# Patient Record
Sex: Male | Born: 1945 | Race: White | Hispanic: No | Smoking: Current every day smoker
Health system: Southern US, Community
[De-identification: ages and names within clinical notes are randomized; demographics above are authoritative.]

## PROBLEM LIST (undated history)

## (undated) DIAGNOSIS — E7800 Pure hypercholesterolemia, unspecified: Secondary | ICD-10-CM

## (undated) DIAGNOSIS — I10 Essential (primary) hypertension: Secondary | ICD-10-CM

## (undated) DIAGNOSIS — R569 Unspecified convulsions: Secondary | ICD-10-CM

## (undated) DIAGNOSIS — F329 Major depressive disorder, single episode, unspecified: Secondary | ICD-10-CM

## (undated) DIAGNOSIS — F32A Depression, unspecified: Secondary | ICD-10-CM

## (undated) DIAGNOSIS — F419 Anxiety disorder, unspecified: Secondary | ICD-10-CM

## (undated) HISTORY — PX: OTHER SURGICAL HISTORY: SHX169

---
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
## 1999-02-14 ENCOUNTER — Ambulatory Visit (HOSPITAL_COMMUNITY): Admission: RE | Admit: 1999-02-14 | Discharge: 1999-02-14 | Payer: Self-pay | Admitting: Cardiology

## 1999-02-14 ENCOUNTER — Encounter: Payer: Self-pay | Admitting: Cardiology

## 2001-08-24 ENCOUNTER — Ambulatory Visit (HOSPITAL_COMMUNITY): Admission: RE | Admit: 2001-08-24 | Discharge: 2001-08-24 | Payer: Self-pay | Admitting: Cardiology

## 2001-08-24 ENCOUNTER — Encounter: Payer: Self-pay | Admitting: Cardiology

## 2004-02-29 ENCOUNTER — Inpatient Hospital Stay (HOSPITAL_COMMUNITY): Admission: EM | Admit: 2004-02-29 | Discharge: 2004-03-02 | Payer: Self-pay | Admitting: Emergency Medicine

## 2004-05-10 ENCOUNTER — Emergency Department (HOSPITAL_COMMUNITY): Admission: EM | Admit: 2004-05-10 | Discharge: 2004-05-10 | Payer: Self-pay | Admitting: Emergency Medicine

## 2004-07-03 ENCOUNTER — Ambulatory Visit (HOSPITAL_COMMUNITY): Admission: RE | Admit: 2004-07-03 | Discharge: 2004-07-03 | Payer: Self-pay | Admitting: *Deleted

## 2004-07-03 ENCOUNTER — Ambulatory Visit: Payer: Self-pay | Admitting: *Deleted

## 2004-07-24 ENCOUNTER — Ambulatory Visit: Payer: Self-pay | Admitting: *Deleted

## 2004-10-07 ENCOUNTER — Emergency Department (HOSPITAL_COMMUNITY): Admission: EM | Admit: 2004-10-07 | Discharge: 2004-10-07 | Payer: Self-pay | Admitting: Emergency Medicine

## 2004-10-21 ENCOUNTER — Emergency Department (HOSPITAL_COMMUNITY): Admission: EM | Admit: 2004-10-21 | Discharge: 2004-10-21 | Payer: Self-pay | Admitting: Emergency Medicine

## 2005-02-01 ENCOUNTER — Emergency Department (HOSPITAL_COMMUNITY): Admission: EM | Admit: 2005-02-01 | Discharge: 2005-02-01 | Payer: Self-pay | Admitting: Emergency Medicine

## 2007-04-28 ENCOUNTER — Encounter: Payer: Self-pay | Admitting: Emergency Medicine

## 2007-04-28 ENCOUNTER — Ambulatory Visit: Payer: Self-pay | Admitting: Cardiovascular Disease

## 2007-04-28 ENCOUNTER — Inpatient Hospital Stay (HOSPITAL_COMMUNITY): Admission: EM | Admit: 2007-04-28 | Discharge: 2007-04-30 | Payer: Self-pay | Admitting: Emergency Medicine

## 2007-05-19 ENCOUNTER — Ambulatory Visit: Payer: Self-pay | Admitting: Cardiology

## 2007-05-22 ENCOUNTER — Emergency Department (HOSPITAL_COMMUNITY): Admission: EM | Admit: 2007-05-22 | Discharge: 2007-05-22 | Payer: Self-pay | Admitting: Emergency Medicine

## 2007-06-06 ENCOUNTER — Emergency Department (HOSPITAL_COMMUNITY): Admission: EM | Admit: 2007-06-06 | Discharge: 2007-06-06 | Payer: Self-pay | Admitting: Emergency Medicine

## 2008-01-13 ENCOUNTER — Ambulatory Visit: Payer: Self-pay | Admitting: Cardiology

## 2010-09-09 ENCOUNTER — Emergency Department (HOSPITAL_COMMUNITY)
Admission: EM | Admit: 2010-09-09 | Discharge: 2010-09-09 | Disposition: A | Discharge status: Home / Self Care | Payer: Medicare Other | Attending: Emergency Medicine | Admitting: Emergency Medicine

## 2010-09-09 ENCOUNTER — Emergency Department (HOSPITAL_COMMUNITY): Payer: Medicare Other

## 2010-09-09 DIAGNOSIS — G40909 Epilepsy, unspecified, not intractable, without status epilepticus: Secondary | ICD-10-CM | POA: Insufficient documentation

## 2010-09-09 DIAGNOSIS — R569 Unspecified convulsions: Secondary | ICD-10-CM | POA: Insufficient documentation

## 2010-09-09 LAB — URINALYSIS, ROUTINE W REFLEX MICROSCOPIC
Bilirubin Urine: NEGATIVE
Glucose, UA: NEGATIVE mg/dL
Ketones, ur: NEGATIVE mg/dL
Leukocytes, UA: NEGATIVE
Protein, ur: 30 mg/dL — AB
Specific Gravity, Urine: >1.03 — ABNORMAL HIGH (ref 1.005–1.030)
Urobilinogen, UA: 0.2 mg/dL (ref 0.0–1.0)
pH: 5.5 (ref 5.0–8.0)

## 2010-09-09 LAB — COMPREHENSIVE METABOLIC PANEL
ALT: 14 U/L (ref 0–53)
AST: 20 U/L (ref 0–37)
Albumin: 4.1 g/dL (ref 3.5–5.2)
Alkaline Phosphatase: 61 U/L (ref 39–117)
Calcium: 8.9 mg/dL (ref 8.4–10.5)
Chloride: 105 mEq/L (ref 96–112)
Creatinine, Ser: 0.91 mg/dL (ref 0.4–1.5)
GFR calc Af Amer: >60 mL/min (ref >60)
GFR calc non Af Amer: >60 mL/min (ref >60)
Glucose, Bld: 163 mg/dL — ABNORMAL HIGH (ref 70–99)
Potassium: 3.9 mEq/L (ref 3.5–5.1)
Sodium: 133 mEq/L — ABNORMAL LOW (ref 135–145)
Total Protein: 6.9 g/dL (ref 6.0–8.3)

## 2010-09-09 LAB — CBC
HCT: 43.7 % (ref 39.0–52.0)
Hemoglobin: 15.1 g/dL (ref 13.0–17.0)
MCH: 32.1 pg (ref 26.0–34.0)
MCHC: 34.6 g/dL (ref 30.0–36.0)
MCV: 92.8 fL (ref 78.0–100.0)
Platelets: 266 10*3/uL (ref 150–400)
RBC: 4.71 MIL/uL (ref 4.22–5.81)
RDW: 12.2 % (ref 11.5–15.5)
WBC: 21.6 10*3/uL — ABNORMAL HIGH (ref 4.0–10.5)

## 2010-09-09 LAB — POCT CARDIAC MARKERS
CKMB, poc: 1 ng/mL (ref 1.0–8.0)
Myoglobin, poc: >500 ng/mL (ref 12–200)
Troponin i, poc: <0.05 ng/mL (ref 0.00–0.09)

## 2010-09-09 LAB — ETHANOL: Alcohol, Ethyl (B): <5 mg/dL (ref 0–10)

## 2010-09-09 LAB — DIFFERENTIAL
Basophils Absolute: 0.1 10*3/uL (ref 0.0–0.1)
Basophils Relative: 0 % (ref 0–1)
Eosinophils Relative: 0 % (ref 0–5)
Lymphocytes Relative: 10 % — ABNORMAL LOW (ref 12–46)
Monocytes Absolute: 1.7 10*3/uL — ABNORMAL HIGH (ref 0.1–1.0)
Neutro Abs: 17.7 10*3/uL — ABNORMAL HIGH (ref 1.7–7.7)
Neutrophils Relative %: 82 % — ABNORMAL HIGH (ref 43–77)

## 2010-09-09 LAB — RAPID URINE DRUG SCREEN, HOSP PERFORMED
Amphetamines: NOT DETECTED
Barbiturates: NOT DETECTED
Benzodiazepines: POSITIVE — AB
Cocaine: NOT DETECTED
Opiates: NOT DETECTED

## 2010-09-09 LAB — URINE MICROSCOPIC-ADD ON

## 2010-09-20 ENCOUNTER — Emergency Department (HOSPITAL_COMMUNITY)
Admission: EM | Admit: 2010-09-20 | Discharge: 2010-09-20 | Disposition: A | Discharge status: Home / Self Care | Payer: Medicare Other | Attending: Emergency Medicine | Admitting: Emergency Medicine

## 2010-09-20 DIAGNOSIS — I10 Essential (primary) hypertension: Secondary | ICD-10-CM | POA: Insufficient documentation

## 2010-09-20 DIAGNOSIS — Z79899 Other long term (current) drug therapy: Secondary | ICD-10-CM | POA: Insufficient documentation

## 2010-09-20 DIAGNOSIS — I2510 Atherosclerotic heart disease of native coronary artery without angina pectoris: Secondary | ICD-10-CM | POA: Insufficient documentation

## 2010-09-20 DIAGNOSIS — IMO0002 Reserved for concepts with insufficient information to code with codable children: Secondary | ICD-10-CM | POA: Insufficient documentation

## 2010-09-20 DIAGNOSIS — E785 Hyperlipidemia, unspecified: Secondary | ICD-10-CM | POA: Insufficient documentation

## 2010-09-20 DIAGNOSIS — I252 Old myocardial infarction: Secondary | ICD-10-CM | POA: Insufficient documentation

## 2010-09-20 DIAGNOSIS — G40909 Epilepsy, unspecified, not intractable, without status epilepticus: Secondary | ICD-10-CM | POA: Insufficient documentation

## 2010-09-20 DIAGNOSIS — Z8673 Personal history of transient ischemic attack (TIA), and cerebral infarction without residual deficits: Secondary | ICD-10-CM | POA: Insufficient documentation

## 2010-09-20 DIAGNOSIS — Y929 Unspecified place or not applicable: Secondary | ICD-10-CM | POA: Insufficient documentation

## 2010-09-20 DIAGNOSIS — Z85820 Personal history of malignant melanoma of skin: Secondary | ICD-10-CM | POA: Insufficient documentation

## 2010-10-28 NOTE — Letter (Signed)
January 13, 2008    Edward L. Juanetta Gosling, M.D.  [address]  [place], [place] [address]   RE:  CYNTHIA, STAINBACK  MRN:  [id number]  /  DOB:  05/29/1946   Dear Ed:   Mr. Stegall returns to the office for continued assessment and treatment  of ischemic cardiomyopathy.  Since last visit, he has done fine.  He  reports no medical contacts for significant problems.  He has had no  chest pain or dyspnea.  He has cut back on cigarette smoking, but  continues to consume one-half pack per day.  He tried Chantix without  success.   A recent laboratory from May.  Chemistry profile was normal except for  glucose of 153.  Lipid profile was excellent with total cholesterol 112,  triglycerides 77, HDL 41, and LDL 56.  LFTs were normal as was TSH.   CURRENT MEDICATIONS:  1. Aspirin 325 mg daily.  2. Simvastatin 20 mg daily.  3. Metoprolol 100 mg daily.  4. Quinapril 20 mg daily.   PHYSICAL EXAMINATION:  GENERAL:  Thin, pleasant gentleman, in no acute  distress.  VITAL SIGNS:  The weight is 132, unchanged.  Blood pressure 100/50,  heart rate 72 and regular, respirations 14.  NECK:  No jugular venous distention; no carotid bruits.  LUNGS:  Few expiratory rhonchi.  CARDIAC:  Normal first and second heart sounds; modest early systolic  ejection murmur.  ABDOMEN:  Soft and nontender; no organomegaly.  EXTREMITIES:  1/2+ edema of the left ankle.  SKIN:  Multiple tattoos.   IMPRESSION:  Mr. Spittler is doing generally well.  We will check a  hemoglobin A1c level to determine whether or not he needs treatment for  early diabetes.  Blood pressure control is good.  He has no symptoms  related to ischemic heart disease.  I will see this nice gentleman again  in 1 year.    Sincerely,      Gerrit Friends. Dietrich Pates, MD, [facility]  Electronically Signed    RMR/MedQ  DD: 01/13/2008  DT: 01/14/2008  Job #: [id number]

## 2010-10-28 NOTE — Letter (Signed)
May 19, 2007    Ramon Dredge L. Juanetta Gosling, M.D.  [address]  [place], [place] [address]   RE:  [name], [name]  MRN:  [id number]  /  DOB:  1946-06-08   Dear Ed:   [name] returns to the office, having previously been followed by  [name].  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  He has done well since  discharge.   CURRENT MEDICATIONS:  1. Aspirin 325 mg daily.  2. Simvastatin 20 mg daily.  3. Toprol 100 mg daily.  4. Quinapril 20 mg daily.   Recent lipid profile is excellent.  Blood pressure control has been  excellent.   EXAMINATION:  Trim, pleasant gentleman in no acute distress.  The weight  is 133, 25 pounds less than 2006.  Blood pressure 115/60, heart rate 62  and regular, respirations 18.  NECK:  No jugular venous distention; no carotid bruits.  LUNGS:  Few rhonchi; normal expiratory phase.  CARDIAC:  Normal first and second heart sounds; fourth heart sound  present.  CATH SITE:  Benign.  EXTREMITIES:  No edema; distal pulses intact.   IMPRESSION:  [name] is stable with respect to coronary disease and  doing well with respect to risk factors except for cigarette smoking.  We will work with his insurance to find a cost effective solution for  him.  He has tried nicotine replacement therapy in the past without  benefit.  I will plan to see this gentleman again in 6 months.    Sincerely,      Gerrit Friends. Dietrich Pates, MD, [facility]  Electronically Signed    RMR/MedQ  DD: 05/19/2007  DT: 05/19/2007  Job #: [id number]

## 2010-10-28 NOTE — H&P (Signed)
[name], Whitney                ACCOUNT NO.:  [id number]   MEDICAL RECORD NO.:  [id number]          PATIENT TYPE:  INP   LOCATION:  4703                         FACILITY:  [facility]   PHYSICIAN:  Veverly Fells. Excell Seltzer, MD  DATE OF BIRTH:  24-Mar-1946   DATE OF ADMISSION:  04/28/2007  DATE OF DISCHARGE:                              HISTORY & PHYSICAL   PRIMARY PHYSICIANS:  Primary care physician is Dr. Kari Baars in  [place], [place].  Primary cardiologist was Dr. Vida Roller  in [place].   CHIEF COMPLAINT:  Chest pain.   HISTORY OF PRESENT ILLNESS:  [name] is a 65 year old male with  known coronary artery disease.  He has not been cathed since 2000 and  his last stress test was in 2005 and was without ischemia.  Mr. Stthomas  has chronic exertional dyspnea and occasional chest pain, but today he  had onset of substernal chest pain at rest.   [name] chest pain began at approximately 9:30 a.m.  He describes  it as a pressure and a 9/10.  It started at rest and radiated to his  left arm with pain and numbness.  It had associated shortness of breath,  nausea and diaphoresis.  He took sublingual nitroglycerin times three at  home, but his symptoms did not change significantly.  He went to [facility] emergency room and there was treated with IV nitroglycerin,  morphine, heparin with decrease in his pains to a 3/10, which it is now.  He also has chronic back and knee pain which he states right now is much  worse than his chest pain and for which he is overdue on his normal  medications.  This chest pain is the same as the chest pain that he has  had in the past and he feels that he had some general malaise yesterday,  but the chest pain did not start until this morning.  It has been  continuous since 9:30 a.m.   PAST MEDICAL HISTORY:  1. Status post cardiac catheterization in 2000 with LAD 25% times two      lesions, circumflex 50%, and ramus intermedius 50%,  RCA total with      right-to-right collaterals and an EF of 50 to 55%.  2. Peripheral vascular disease with bilateral 25% renal artery      stenosis, abdominal aorta 30% stenosis at the time of his      catheterization in 2000.  3. Per the patient, a history of a slight stroke several years ago, no      further information available.  4. Ongoing tobacco use.  5. Hypertension.  6. Hyperlipidemia.  7. Family history of coronary artery disease.  8. Per the patient, a history of MI times four, the first one in 1996      and the last one approximately 2006, although computer records do      not reflect this.   PAST SURGICAL HISTORY:  He is status post cardiac catheterization as  well as right knee melanoma removal.   ALLERGIES:  NO KNOWN DRUG ALLERGIES.  CURRENT MEDICATIONS:  1. Xanax 1 mg p.o. q.6 h.  2. Lortab 7.5/500 q.6 h.  3. Toprol XL 100 mg a day.  4. Aspirin 325 mg a day.  5. Restoril 30 mg q.h.s. p.r.n.  6. Zocor 20 mg a day.  7. Accupril 20 mg a day.   SOCIAL HISTORY:  He lives alone in [place] and is disabled.  He has  greater than a 75 pack-year history of ongoing tobacco use, but denies  alcohol or drug abuse.   FAMILY HISTORY:  He states his mother and father both died in their 28s  and both had coronary artery disease, and he had one sister that died of  cancer, but she did not have CAD.   REVIEW OF SYSTEMS:  Significant for the chest pain and shortness of  breath described above.  He also has some chronic dyspnea on exertion  and an occasional PND, but denies orthopnea, edema or palpitations.  He  states that he coughs occasionally, but it is a nonproductive cough and  does not wheeze.  He states he has some left-sided weakness and  occasional dizziness.  He has chronic back and lower extremity pain.  He  denies hematemesis, hemoptysis or melena.  A full 14-point review of  systems is otherwise negative.   PHYSICAL EXAMINATION:  VITAL SIGNS:  Temperature  of 98.9.  Blood  pressure is 91/48.  Heart rate 70.  Respiratory rate 20.  O2 saturation  99% on two liters.  GENERAL:  He is a well-developed, well-nourished white male in slight  distress.  HEENT:  Essentially normal except for poor dentition.  NECK:  His JVD is at 6 cm and a soft right carotid bruit is appreciated,  but no bruit on the left.  There is no lymphadenopathy or thyromegaly.  CV:  His heart is regular in rate and rhythm with an S1, S2 and a very  soft murmur is noted.  Distal pulses are intact in all four extremities.  He has bilateral femoral bruits noted.  LUNGS:  He has rales in both bases.  SKIN:  No rashes or lesions are noted.  ABDOMEN:  Soft and nontender with active bowel sounds.  EXTREMITIES:  There is no clubbing, cyanosis or edema noted.  MUSCULOSKELETAL:  There is no joint deformity, effusions and no spine or  CVA tenderness.  NEUROLOGICAL:  At this time, he is alert and oriented.  Cranial nerves  II through XII are grossly intact.   LABORATORY DATA:  Per his pharmacist, the last time he came into the  pharmacy, he was oriented to name only and thought that he had not  picked up medications, which in fact he had picked up last week.   Chest x-ray performed at [facility] showed stable mild pulmonary  vascular condition without edema or any acute disease.   EKG with chest pain is sinus rhythm, rate 80 with no acute ischemic  changes.   Laboratory values:  Point of care markers are negative times one.  Urine  drug screen is positive only for opiates and benzodiazepines,  ETOH less  than 5.  Sodium 141, potassium 4.0, chloride 107, CO2 of 27, BUN 13,  creatinine 0.71, glucose 131.  Other CMET values within normal limits.  PTT 30, INR 0.9, hemoglobin 14.0, hematocrit 42.4, WBC is 12.6,  platelets 275,000.   IMPRESSION/PLAN:  [name] has recurrent chest pain that improved  with intravenous nitroglycerin.  He has known coronary artery disease  and  prior  myocardial infarction.  His electrocardiogram shows probable  inferior myocardial infarction, but the age is undeterminable.  He has  no acute ischemic changes and enzymes are initially negative.  He will  be admitted and cardiac enzymes will be cycled.  He will be continued on  aspirin, IV heparin and IV nitroglycerin.  Cardiac catheterization will  be performed in the morning.  Mr. Mccorkel is aware of the risks and  benefits of the procedure and agrees to proceed.  We will continue to  use pain control medications including his home medications to try and  control his pain, but no urgent catheterization is planned unless he has  objective evidence of ischemia.      Theodore Demark, PA-C      Veverly Fells. Excell Seltzer, MD  Electronically Signed    RB/MEDQ  D:  04/28/2007  T:  04/29/2007  Job:  [id number]   cc:   Ramon Dredge L. Juanetta Gosling, M.D.

## 2010-10-28 NOTE — Cardiovascular Report (Signed)
NAMEZEDRIC, DEROY                ACCOUNT NO.:  [id number]   MEDICAL RECORD NO.:  [id number]          PATIENT TYPE:  INP   LOCATION:  4703                         FACILITY:  [facility]   PHYSICIAN:  Veverly Fells. Excell Seltzer, MD  DATE OF BIRTH:  12-22-1945   DATE OF PROCEDURE:  DATE OF DISCHARGE:                            CARDIAC CATHETERIZATION   PROCEDURE:  Left heart catheterization, selective coronary angiography,  left ventricular angiography.   INDICATIONS:  Mr. Shuler is a 65 year old gentleman who presented with  ongoing chest pain.  He has multiple cardiac risk factors and known CAD.  He had a catheterization back in 2000 that demonstrated an occluded  right coronary artery with left-to-right collaterals and mild disease in  his LAD and left circumflex.  He has been sent to the hospital since  that time but has refused a cardiac cath.  He presented once again with  chest pain and has agreed to a catheterization in the setting of his  multiple risk factors and known CAD.   The risks and indications of the procedure were reviewed with the  patient and informed consent was obtained.  A 6-French sheath was placed  in the right femoral artery using the modified Seldinger technique.  Standard 6-French catheters were used for coronary angiography. A 6-  French angled pigtail catheter was used for left ventriculography.  A  pullback across the aortic valve was done.  All catheter exchanges were  performed over a guidewire.  There were no immediate complications.   FINDINGS:  Aortic pressure 149/68 with a mean of 103, left ventricular  pressure is 149/6.   CORONARY ANGIOGRAPHY:  The left mainstem is angiographically normal.  It  bifurcates into the LAD and left circumflex.   The LAD is a large-caliber vessel that courses down and wraps around the  LV apex.  There is mild calcification in the proximal LAD.  There are  also diffuse mild luminal irregularities that create only a 20%  stenosis  through that area. Beyond the first diagonal branch,  there is no  significant angiographic stenosis throughout the remainder of the mid  and distal LAD. The first diagonal branch is a medium-size vessel. The  second diagonal is small.   The left circumflex is of medium caliber. It courses down and supplies  two OM branches.  There is no significant angiographic stenosis  throughout the circumflex.   There is a small ramus intermedius.  The ramus has a 30-40% stenosis in  the proximal portion of the vessel.  It bifurcates into twin vessels in  the mid portion of the vessel.  There are no high-grade stenoses in the  intermediate branch.   The right coronary artery is occluded at its ostium. There is minimal  antegrade flow throughout the vessel. There are well-formed left-to-  right collaterals that supply the distal branches of the right coronary  artery and retrograde fill the vessel back to the mid portion.  There is  heavy calcification of the posterior AV segment and there appears to be  severe stenosis throughout that vessel with diffuse 90% stenosis.  A  posterolateral branch as well as the PDA branch both fill from left-to-  right collaterals.   Left ventricular function assessed by 30 degrees RAO left  ventriculography shows inferior basal akinesis.  The LVEF is 45%.  There  is no significant mitral regurgitation.   ASSESSMENT:  1. Total occlusion of the right coronary artery with left-to-right      collaterals.  2. Nonobstructive left anterior descending, left circumflex and ramus      stenosis.  3. Mild segmental left ventricular dysfunction consistent with prior      inferior wall myocardial infarction.  4. Stable coronary anatomy from heart catheterization in 2000.   Recommend continued medical therapy.  The patient has stable coronary  anatomy.  Tobacco cessation was advised.  The patient will follow-up in  [place].      Veverly Fells. Excell Seltzer, MD   Electronically Signed     MDC/MEDQ  D:  04/29/2007  T:  04/29/2007  Job:  [id number]   cc:   Pricilla Riffle, MD, [facility]

## 2010-10-28 NOTE — Discharge Summary (Signed)
[name], [name]                ACCOUNT NO.:  [id number]   MEDICAL RECORD NO.:  [id number]          PATIENT TYPE:  INP   LOCATION:  4703                         FACILITY:  [facility]   PHYSICIAN:  Bevelyn Buckles. Bensimhon, MDDATE OF BIRTH:  July 08, 1945   DATE OF ADMISSION:  04/28/2007  DATE OF DISCHARGE:  04/30/2007                               DISCHARGE SUMMARY   CARDIOLOGIST:  Gerrit Friends. Dietrich Pates, MD, [facility].  (He was previously seen by  Dr. Vida Roller.).   PRIMARY CARE PHYSICIAN:  Edward L. Juanetta Gosling, M.D.   REASON FOR ADMISSION:  Chest pain.   DISCHARGE DIAGNOSES:  1. Chest pain, etiology unclear.  2. Coronary artery disease with a total occlusion of the right      coronary artery with left-to-right collaterals and nonobstructive      disease in the left anterior descending, left circumflex and ramus      intermedius, medical therapy.  3. Ischemic cardiomyopathy with ejection fraction of 45%.  4. Hypertension.  5. Dyslipidemia.  6. Tobacco abuse.  7. Family history of coronary disease.  8. History of cerebrovascular accident.  9. History of melanoma excision.   ADMISSION HISTORY:  Mr. Stfleur is a 65 year old male patient who had  previously been seen by [name] in our [place] office.  He had a  cardiac catheterization in 2000 that revealed a totally occluded RCA and  nonobstructive disease elsewhere.  He had a stress test in 2005 that  showed no ischemia.  He presented in transfer from [facility]  to [facility] on April 28, 2007, with chest pain.  It began  around 9:30 in the morning on the date of admission and radiated to his  left arm with pain and numbness, associated shortness of breath, nausea  and diaphoresis.  He was admitted for unstable angina pectoris with  plans for cardiac catheterization.   HOSPITAL COURSE:  The patient was treated with aspirin, heparin,  nitroglycerin.  He was ruled out for myocardial infarction by enzymes.  He was  taken to the cardiac catheterization lab by Dr. Excell Seltzer on  November 14.  This revealed totally occluded RCA with left-to-right  collaterals.  He had nonobstructive disease in the LAD, left circumflex,  and small ramus intermedius.  His EF was noted to be slightly reduced at  45% with inferobasal akinesis.  Medical therapy was recommended.   That evening, the patient's groin did bleed.  Pressure was held for  several minutes, and he was returned to bed rest for four more hours.  On the morning of April 30, 2007, he was interviewed and examined by  Dr. Gala Romney.  His groin was stable without hematoma or bruit and no  further bleeding.  He had no further chest pain.  His hemoglobin was  stable.  It was felt that the patient was stable enough for discharge to  home with plans for outpatient followup.   LABORATORY AND ANCILLARY DATA:  At discharge, hemoglobin was 10.2,  hematocrit 30.7.  Admission hemoglobin was 14, hematocrit 42.4, white  count 9300, platelet count 209,000. INR 0.9.  Sodium 141, potassium 4,  glucose 131, BUN 13, creatinine 0.71.  LFTs okay. Hemoglobin A1c 5.6.  Cardiac markers as noted above.  Total cholesterol 99, triglycerides of  62, HDL 34, LDL 53.  TSH 0.921.  Urine drug screen positive for  benzodiazepines and opiates   Admission chest x-ray showed stable mild pulmonary vascular congestion.  No acute cardiopulmonary abnormality.   DISCHARGE MEDICATIONS:  1. Aspirin 325 mg daily.  2. Xanax 1 mg q. 6 h p.r.n.  3. Hydrocodone/APAP 7.5/500 mg q. 6 h p.r.n.  4. Toprol XL 100 mg daily.  5. Zocor 20 mg p.o. nightly.  6. Accupril 20 mg daily.  7. Restoril 30 mg nightly p.r.n.  8. Nitroglycerin p.r.n. chest pain.   DIET:  Low-fat, low-sodium diet.   ACTIVITY:  The patient will increase activity slowly.  He may walk up  steps.  He may shower.  He is to do no lifting, driving or sexual  activity for 3 days.  He may return to work on May 04, 2007.   WOUND  CARE:  He should call our office in [place] for any groin  swelling, bleeding, bruising or fever.   FOLLOWUP:  1. He should see Dr. Juanetta Gosling in the next 2 weeks.  He should call for      appointment.  2. We will arrange followup in the [place] office with Dr. Dietrich Pates      in the next 2 weeks, and the office will contact him with an      appointment.   Total physician and PA time:  Greater than 30 minutes.      Tereso Newcomer, PA-C      Bevelyn Buckles. Bensimhon, MD  Electronically Signed    SW/MEDQ  D:  04/30/2007  T:  05/01/2007  Job:  [id number]   cc:   Ramon Dredge L. Juanetta Gosling, M.D.

## 2010-10-31 NOTE — Consult Note (Signed)
NAMEABRAHAN, [name] NO.:  [id number]   MEDICAL RECORD NO.:  [id number]                   PATIENT TYPE:  INP   LOCATION:  IC06                                 FACILITY:  [facility]   PHYSICIAN:  Pricilla Riffle, M.D.                 DATE OF BIRTH:  Aug 17, 1945   DATE OF CONSULTATION:  02/29/2004  DATE OF DISCHARGE:                                   CONSULTATION   IDENTIFICATION:  The patient is a 65 year old who we are asked to see  regarding chest pain.  He has a history of coronary artery disease.   HISTORY OF PRESENT ILLNESS:  The patient's cardiac history dates back to at  least 2000.  At that time he underwent cardiac catheterization that showed a  proximal 25% LAD lesion, 25% mid LAD lesion, left circumflex at the time had  a 50% lesion in the mid portion, ramus intermedius had a 50% lesion.  RCA  was noted to be 100% occluded proximally with left to right collaterals.  Renal arteries had 25% stenoses, distal abdominal aorta had 30% lesion, EF  at the time was 50-55%.   The patient has been followed in Cardiology Clinic since he was last seen in  June.  He has had stable angina.  Has refused repeat evaluations though  with stress test or catheterization.   The patient came to the emergency room after he began having left-sided  chest pain radiating to his left arm yesterday.  He describes the pain as  sharp followed a dull ache.  He states that this is similar to his previous  anginal spells.  Began at rest.  He took 2 nitroglycerin with minimal  relief, called 911 and was given a spray of nitroglycerin with some relief  and then placed on IV.  Now pain is currently 1/10 in intensity, it is not  pleuritic, not positional, again it is like his previous angina.   Prior to this he said he had had occasional episodes of chest pressure  brought on with exertion, resolved with rest or with 1 sublingual  nitroglycerin.   ALLERGIES:  NONE.   MEDICATIONS  PRIOR TO ADMISSION:  1.  Toprol 100 daily.  2.  Zocor 20 daily.  3.  Aspirin 325 daily.  4.  Xanax p.r.n.  5.  Vicodin p.r.n.   MEDICATIONS IN HOSPITAL:  1.  Aspirin 325 daily.  2.  Lovenox 78 mg q.12 h.  3.  Nicotine patch.  4.  Zocor 20 q.h.s.  5.  Half-normal saline at 20 per hour.  6.  IV nitroglycerin.   PAST MEDICAL HISTORY:  1.  Coronary artery disease.  2.  Hypertension.  3.  Dyslipidemia.  4.  Continued tobacco abuse.   SOCIAL HISTORY:  The patient lives in [place], he is disabled as a  Chartered certified accountant, he is a widow with three children, ongoing tobacco  use greater  than 60-pack-year, walks daily.  Notes occasional ETOH.   FAMILY HISTORY:  Mother died of an MI at age 22.  Father died of an MI at  age 41.   REVIEW OF SYSTEMS:  Notes chronic shortness of breath, dyspnea, otherwise  all systems reviewed negative except above problems, except as noted  previously.   PHYSICAL EXAMINATION:  On exam the patient notes some mild chest pressure  1/10 in intensity.  Blood pressure 117/68 to 130/81.  Pulse is 70s-80s.  Temperature 97.7, O2 saturation on room air 98%.  HEENT:  Normocephalic and atraumatic.  PERRL.  NECK:  JVP is normal.  No definite bruits.  LUNGS:  Coarse breath sounds, coarse rhonchi throughout.  CARDIAC:  Regular rate and rhythm, S1, S2, no S3 or S4 noted.  Grade 1-2/6  systolic murmur heard best at the left sternal border radiating to the apex.  ABDOMEN:  Supple, no hepatosplenomegaly.  EXTREMITIES:  Good distal pulses, bilateral femoral bruits.   A 12-Lead EKG shows a normal sinus rhythm at a rate of 84 beats per minute.  Possible inferior wall MI.  Nonspecific ST-T wave changes.   LABS:  Significant for hemoglobin 14.4, BUN 8, creatinine 0.6, WBC 10.3,  potassium 3.6, initial CK-MB of 74 and 1.5, troponin 0.01.   IMPRESSION:  Mr. Hopkin is a 65 year old gentleman with known coronary  artery disease who comes in now with unstable angina.  EKG is  unremarkable.  His pain is improving on medical therapy.   Would recommend cardiac catheterization to define.  We will continue to use  medical therapy, add IV Integrilin to his regimen, and begin with low dose  IV Lopressor and metoprolol.  Discussed this with the patient, he agrees to  proceed.  We will check a fasting lipid panel.  Again counseled on tobacco  cessation.      PVR/MEDQ  D:  02/29/2004  T:  03/02/2004  Job:  [id number]

## 2010-10-31 NOTE — Procedures (Signed)
[facility]  Patient:    [name], [name] Visit Number: [id number] MRN: [id number]          Service Type: OUT Location: RAD Attending Physician:  Cain Sieve Dictated by:   Delton See, P.A. Proc. Date: 08/24/01 Admit Date:  08/24/2001   CC:         Kari Baars, M.D.   Stress Test  DATE OF BIRTH:  09-14-1945  BRIEF HISTORY:  [name] is a pleasant 65 year old male who underwent cardiac catheterization in 2000 and was found to have mild to moderate coronary artery disease with a total RCA with collateral circulation.  His ejection fraction was 50-55% at that time.  He has a history of peripheral vascular disease as well as tobacco use and elevated cholesterol levels as well as a positive family history of coronary artery disease and hypertension. He was recently seen in the office on May 20, 2001 and scheduled for an exercise Cardiolite to further evaluate his coronary artery disease.  Prior to the study today the patient had no recent complaints of chest pain or shortness of breath.  His EKG showed sinus rhythm, rate 72 beats per minute with small inferior as well as lateral Qs.  His target heart rate was 140 beats per minute.  His blood pressure was 160/94 at rest.  The patient was able to exercise for a total of seven minutes and 22 seconds reaching a maximum heart rate of 138.  As noted, his target heart rate was 140.  The patient was actually injected at six minutes into the study at which time his heart rate was 128 beats per minute.  He was beginning to tire at that time and he was developing back and neck pain.  The patient did not experience any chest pain.  As noted, the limiting factor was his back pain.  The EKG revealed no ischemic changes.  The final images are pending at time of this dictation. Dictated by:   Delton See, P.A. Attending Physician:  Cain Sieve DD:  08/24/01 TD:  08/25/01 Job:  [id number]

## 2010-10-31 NOTE — Discharge Summary (Signed)
Anthony, Whitney                ACCOUNT NO.:  [id number]   MEDICAL RECORD NO.:  [id number]          PATIENT TYPE:  INP   LOCATION:  3706                          FACILITY:  [facility]   PHYSICIAN:  Edward L. Juanetta Gosling, M.D.DATE OF BIRTH:  Mar 26, 1946   DATE OF ADMISSION:  02/29/2004  DATE OF DISCHARGE:  09/18/2005LH                                 DISCHARGE SUMMARY   FINAL DISCHARGE DIAGNOSES:  1.  Chest pain, myocardial infarction ruled out.  2.  Coronary artery occlusive disease.  3.  Hypertension.  4.  Hyperlipidemia.  5.  Probable chronic obstructive pulmonary disease.  6.  Anxiety.  7.  Chronic pain syndrome.   HISTORY:  [name] is a 64 year old with known coronary artery occlusive  disease who came to the emergency room because of chest discomfort which is  in the mid sternal area, and then moved to his left arm.  He said that it  alternates between sharp and dull, and that it is very similar to what he  had when he had previously had problems with his heart.  He was seen in the  emergency room.  His situation was discussed with Dr. Andee Lineman who was on call  for the [facility] who he had seen before.  It was felt that  he should be admitted, but there were no beds available at [facility], so he  was admitted to [facility].   PHYSICAL EXAMINATION:  GENERAL:  The physical examination showed a well-  nourished, sleepy male.  VITAL SIGNS:  Blood pressure 102/70, pulse 80, respirations 18.  He was  afebrile.  CHEST:  Actually clear.  HEART:  Regular without gallop.  ABDOMEN:  Soft, no masses felt.   His point-of-care markers were negative as was his first set of cardiac  enzymes.  He was placed on a nitroglycerin drip and was still having chest  discomfort.  Dr. Tenny Craw saw him and felt that he needed further evaluation,  and he was transferred to the [facility] on the 16th, the same day  as his admission.   Dictation ended at this point.      ELH/MEDQ   D:  03/05/2004  T:  03/06/2004  Job:  [id number]

## 2010-10-31 NOTE — Procedures (Signed)
NAMECURBY, CARSWELL NO.:  [id number]   MEDICAL RECORD NO.:  [id number]          PATIENT TYPE:  REC   LOCATION:  RAD                           FACILITY:  [facility]   PHYSICIAN:  Vida Roller, M.D.   DATE OF BIRTH:  1946-03-02   DATE OF PROCEDURE:  DATE OF DISCHARGE:                                    STRESS TEST   PROCEDURE:  Adenosine Cardiolite Stress Test   INDICATIONS FOR PROCEDURE:  This is a pleasant, 65 year old male with a  history of coronary artery disease.  He underwent cardiac catheterization in  2000, and was found to have mild to moderate coronary artery disease with a  total RCA with collaterals.  Medical therapy was recommended.  His ejection  fraction at the time of catheterization was 50-55%.  Other history for this  patient significant for ongoing tobacco abuse, peripheral vascular disease,  hyperlipidemia, positive family history of coronary artery disease and  hypertension.   In September, the patient was admitted to the hospital for evaluation of  chest pain.  We do not have those complete records at this time, however,  apparently an MI was ruled out.  He was also admitted to the hospital in  November for a possible CVA.  However, a head CT was negative for any acute  event and it was felt that he possibly experienced a TIA.  The patient has  been scheduled for an adenosine Cardiolite today.  He has had no recent  symptoms of chest pain.   RESULTS:  Baseline EKG showed sinus rhythm with rate 77.  There were  inferior and lateral Q's with nonspecific changes.  His resting blood  pressure is 100/50.   Adenosine was administered minutes 1-5.  Myoview was injected at 4 minutes  into the study.  The patient experienced some odd sensations in his head,  but did not have any chest pain or shortness of breath.  There were no  significant EKG changes.  He had an occasional to rare PVC in the recovery  period, but otherwise the EKG was unremarkable  compared to his baseline.  The final images are pending at the time of this dictation.                               Delton See, P.A. [facility]      Vida Roller, M.D.  Electronically Signed    DR/MEDQ  D:  07/03/2004  T:  07/03/2004  Job:  [id number]   cc:   Ramon Dredge L. Juanetta Gosling, M.D.  [address]  [place]  [place] [address]  Fax: [phone]

## 2010-10-31 NOTE — H&P (Signed)
NAMEZURIEL, ROSKOS                            ACCOUNT NO.:  [id number]   MEDICAL RECORD NO.:  [id number]                   PATIENT TYPE:  INP   LOCATION:  IC06                                 FACILITY:  [facility]   PHYSICIAN:  Edward L. Juanetta Gosling, M.D.             DATE OF BIRTH:  Dec 06, 1945   DATE OF ADMISSION:  02/29/2004  DATE OF DISCHARGE:                                HISTORY & PHYSICAL   HISTORY OF PRESENT ILLNESS:  Mr. Nichols is a 65 year old who has known  coronary disease.  He came to the emergency room with chest discomfort in  the mid sternal area, which moved to his left arm.  He said that it  alternates between a fairly sharp pain and a dull pain.  He says that it is  very similar to what he had when he has had heart difficulties before.  He  was evaluated in the emergency room, discussion with Dr. Andee Lineman, and it was  felt that he should be admitted for further evaluation.  There were no beds  at [facility], so he was evaluated here.   PAST MEDICAL HISTORY:  Positive for back injury with history of chronic pain  from that.  He has significant anxiety, and he has had previous history of  cardiac disease.  Apparently, he underwent a cardiac catheterization,  although I do not have all the records on that, in 2000 and was found to  have mild-to-moderate coronary disease, total right, with collateral  circulation at that time.  He also had known peripheral vascular disease.  He rated his pain at about an 8 in the emergency room.   MEDICATIONS ON ADMISSION:  1.  Lortab 7.5 one q.i.d. p.r.n. pain.  2.  Restoril 30 mg at bedtime.  3.  Toprol 100 daily.  4.  Zocor 20 mg at bedtime.  5.  Xanax 1 mg q.i.d. p.r.n.   SOCIAL HISTORY:  He smokes about two packs of cigarettes daily.  He does  drink alcohol most days, and, in fact, smelled of alcohol when he arrived in  the emergency room.   REVIEW OF SYSTEMS:  Except as mentioned is negative.   FAMILY HISTORY:  Positive for hypertension  in multiple family members and  history of coronary disease.   PHYSICAL EXAMINATION:  GENERAL:  A well-developed, well-nourished, sleepy  male.  VITAL SIGNS:  Blood pressure 102/70, pulse is 80, respirations 18, he is  afebrile.  HEENT:  His pupils are reactive.  Nose and throat are clear.  NECK:  Supple.  CHEST:  Clear without wheezes.  HEART:  Regular without gallop.  ABDOMEN: Soft without masses.  EXTREMITIES:  Showed no edema.  NEUROLOGIC:  CNS was grossly intact.   LABORATORY DATA:  White count 10,300, hemoglobin 14.4, platelets 174,000.  Point-of-care markers:  Myoglobin was 35, CK-MB less than 1, troponin less  than 0.05.  His BMET showed sodium 139,  potassium 3.6, chloride 108, CO2 23,  glucose 111, BUN 8, creatinine 0.6, calcium 7.9.   EKG shows sinus rhythm, probable old inferior infarction with Q waves  inferiorly, and possible left atrial enlargement.  His formal cardiac  enzymes show CK of 74, MB of 1.5, troponin of 0.01.   ASSESSMENT:  He has had chest pain, history of coronary disease, history of  hypertension, hyperlipidemia, and ongoing smoking.  He does not appear to  have had a myocardial infarction at least at this point, although we are  still early into the evaluation and I am going to ask for cardiology  consultation.  He is on a nitroglycerin drip.  He is on Lovenox.  We will  plan to continue with his other treatments, etc. and follow.     ___________________________________________                                         Oneal Deputy Juanetta Gosling, M.D.   ELH/MEDQ  D:  02/29/2004  T:  02/29/2004  Job:  [id number]

## 2010-10-31 NOTE — Discharge Summary (Signed)
NAMEGOTTI, Anthony NO.:  [id number]   MEDICAL RECORD NO.:  [id number]                   PATIENT TYPE:  INP   LOCATION:  3706                                 FACILITY:  [facility]   PHYSICIAN:  [name], M.D.                 DATE OF BIRTH:  1946/01/05   DATE OF ADMISSION:  02/29/2004  DATE OF DISCHARGE:  03/02/2004                           DISCHARGE SUMMARY - REFERRING   SUMMARY OF HISTORY:  [name] is a 65 year old male who presented to  [facility] Emergency room on September 16 complaining of left anterior chest  discomfort radiating into his left arm associated with shortness of breath.  He described the discomfort as sharp followed by a dull ache similar to his  previous angina.  The onset was at rest with minimal relief after two  sublingual nitroglycerin.  He does report occasional chest discomfort with  exertion which resolves with rest or a sublingual nitroglycerin.  He was  transported via EMS secondary to continued chest discomfort and he was  admitted to [facility].  We were asked to see in consultation on  September 16 secondary to chest discomfort.   PAST MEDICAL HISTORY:  1.  Hypertension.  2.  Hyperlipidemia.  3.  Continued tobacco use.  4.  COPD.  5.  Peripheral vascular disease.  Last catheterization was in September      2000.  Showed a total proximal RCA with left to right collaterals, 25%      proximal LAD, 25% mid LAD, 50% mid circumflex, 50% ramus, 25% bilateral      renals, 30% distal abdominal aorta, EF 50-55%.  Last stress Cardiolite      was in March 2003 with an EF 48%, no ischemia, inferolateral scarring.   LABORATORY DATA:  Chest x-ray performed at [facility] shows airway  thickening, old left rib injuries.  At [facility] on the 16th H&H  was 14.4 and 41.8.  Normal indices.  Platelets 174, WBC 10.3.  Subsequent  hematologies were essentially unremarkable.  Admission PT was 13.1, PTT 36.  Sodium 139, potassium 3.6, BUN 8, creatinine 0.6, glucose 111.  At the time  of discharge sodium was 139, potassium 3.7, BUN 8, creatinine 0.8, glucose  109.  Four CK-MBs and two troponins were negative for myocardial infarction.  Fasting lipids at [facility] showed a total cholesterol 121, triglycerides  103, HDL 43, LDL 57.  EKGs showed normal sinus rhythm with a ventricular  rate of 84, normal axis, small inferior Q-waves, early R-wave, early  repolarization, nonspecific ST-T wave changes.   HOSPITAL COURSE:  [name] was transferred to [facility] for further  evaluation.  At the time of transfer on the 16th he was complaining of back  discomfort.  He was not having any further chest discomfort.  Dr. Ladona Whitney  notes on the 17th patient did not  want to have a heart catheterization,  states he needs to cut back on his activity.  Thus, his intravenous drips  were discontinued.  He was transferred to telemetry with instructions to  increase in activity.  Dr. Ladona Whitney felt if he had recurrent chest discomfort  he should under cardiac catheterization.  Overnight he did not have any  further chest discomfort.  Dr. Eden Whitney saw him on the morning of the 18th and  felt patient could be discharged home.   DISCHARGE DIAGNOSES:  1.  Unstable angina.  2.  Patient refusing cardiac catheterization.  3.  Continued tobacco use.  4.  History as previously mentioned.   DISPOSITION:  He is discharged home.  Coated aspirin 325 daily, Zocor 20 mg  q.h.s., Toprol XL 100 mg daily, Nicoderm patch 14 mg daily, Vicodin, Xanax,  and Restoril as previously, nitroglycerin 0.4 as needed.  He is advised to  maintain low fat and cholesterol diet.  He is advised no smoking or tobacco  products unless indicated above.  The office will call him with arrangements  for a stress Cardiolite to be performed in [place] and he was asked to  call our office to arrange a follow-up with Dr. Dorethea Whitney after his stress  Cardiolite  is performed.  I have left messages at the [facility] office with  this information and have faxed a treadmill form to the [place] office.  At the time of follow-up with Dr. Dorethea Whitney, review of cardiac risk factor  modifications should be pursued as well as reviewing the stress Cardiolite.       EW/MEDQ  D:  03/02/2004  T:  03/03/2004  Job:  [id number]   cc:   [name], M.D.  Fax: [phone]   [name], M.D.  [address]  [place]  [place] [address]  Fax: [phone]

## 2010-11-06 ENCOUNTER — Inpatient Hospital Stay (HOSPITAL_COMMUNITY)
Admission: EM | Admit: 2010-11-06 | Discharge: 2010-11-08 | DRG: 897 | Disposition: A | Discharge status: Home Health | Payer: Medicare Other | Source: Ambulatory Visit | Attending: Pulmonary Disease | Admitting: Pulmonary Disease

## 2010-11-06 ENCOUNTER — Emergency Department (HOSPITAL_COMMUNITY): Payer: Medicare Other

## 2010-11-06 DIAGNOSIS — R569 Unspecified convulsions: Secondary | ICD-10-CM | POA: Diagnosis present

## 2010-11-06 DIAGNOSIS — G8929 Other chronic pain: Secondary | ICD-10-CM | POA: Diagnosis present

## 2010-11-06 DIAGNOSIS — M8448XA Pathological fracture, other site, initial encounter for fracture: Secondary | ICD-10-CM | POA: Diagnosis present

## 2010-11-06 DIAGNOSIS — F17200 Nicotine dependence, unspecified, uncomplicated: Secondary | ICD-10-CM | POA: Diagnosis present

## 2010-11-06 DIAGNOSIS — F411 Generalized anxiety disorder: Secondary | ICD-10-CM | POA: Diagnosis present

## 2010-11-06 DIAGNOSIS — J4489 Other specified chronic obstructive pulmonary disease: Secondary | ICD-10-CM | POA: Diagnosis present

## 2010-11-06 DIAGNOSIS — I10 Essential (primary) hypertension: Secondary | ICD-10-CM | POA: Diagnosis present

## 2010-11-06 DIAGNOSIS — I2510 Atherosclerotic heart disease of native coronary artery without angina pectoris: Secondary | ICD-10-CM | POA: Diagnosis present

## 2010-11-06 DIAGNOSIS — M549 Dorsalgia, unspecified: Secondary | ICD-10-CM | POA: Diagnosis present

## 2010-11-06 DIAGNOSIS — F19939 Other psychoactive substance use, unspecified with withdrawal, unspecified: Principal | ICD-10-CM | POA: Diagnosis present

## 2010-11-06 DIAGNOSIS — E785 Hyperlipidemia, unspecified: Secondary | ICD-10-CM | POA: Diagnosis present

## 2010-11-06 DIAGNOSIS — J449 Chronic obstructive pulmonary disease, unspecified: Secondary | ICD-10-CM | POA: Diagnosis present

## 2010-11-06 DIAGNOSIS — F1320 Sedative, hypnotic or anxiolytic dependence, uncomplicated: Secondary | ICD-10-CM | POA: Diagnosis present

## 2010-11-06 DIAGNOSIS — Z8673 Personal history of transient ischemic attack (TIA), and cerebral infarction without residual deficits: Secondary | ICD-10-CM

## 2010-11-06 LAB — DIFFERENTIAL
Eosinophils Absolute: 0 10*3/uL (ref 0.0–0.7)
Eosinophils Relative: 0 % (ref 0–5)
Lymphocytes Relative: 11 % — ABNORMAL LOW (ref 12–46)
Lymphs Abs: 2 10*3/uL (ref 0.7–4.0)
Monocytes Absolute: 1.1 10*3/uL — ABNORMAL HIGH (ref 0.1–1.0)
Monocytes Relative: 6 % (ref 3–12)
Neutro Abs: 14.7 10*3/uL — ABNORMAL HIGH (ref 1.7–7.7)

## 2010-11-06 LAB — CBC
HCT: 45 % (ref 39.0–52.0)
Hemoglobin: 15.5 g/dL (ref 13.0–17.0)
MCH: 32.2 pg (ref 26.0–34.0)
MCV: 93.6 fL (ref 78.0–100.0)
Platelets: 292 10*3/uL (ref 150–400)
RBC: 4.81 MIL/uL (ref 4.22–5.81)
RDW: 13 % (ref 11.5–15.5)
WBC: 17.8 10*3/uL — ABNORMAL HIGH (ref 4.0–10.5)

## 2010-11-06 LAB — ETHANOL: Alcohol, Ethyl (B): <11 mg/dL — ABNORMAL HIGH (ref 0–10)

## 2010-11-06 LAB — BASIC METABOLIC PANEL
BUN: 37 mg/dL — ABNORMAL HIGH (ref 6–23)
CO2: 16 mEq/L — ABNORMAL LOW (ref 19–32)
Calcium: 10.2 mg/dL (ref 8.4–10.5)
Creatinine, Ser: 1.29 mg/dL (ref 0.4–1.5)
GFR calc non Af Amer: 56 mL/min — ABNORMAL LOW (ref >60)
Glucose, Bld: 252 mg/dL — ABNORMAL HIGH (ref 70–99)
Sodium: 136 mEq/L (ref 135–145)

## 2010-11-07 LAB — URINE MICROSCOPIC-ADD ON

## 2010-11-07 LAB — URINALYSIS, ROUTINE W REFLEX MICROSCOPIC
Glucose, UA: NEGATIVE mg/dL
Leukocytes, UA: NEGATIVE
Nitrite: NEGATIVE
pH: 5.5 (ref 5.0–8.0)

## 2010-11-07 LAB — RAPID URINE DRUG SCREEN, HOSP PERFORMED
Cocaine: NOT DETECTED
Tetrahydrocannabinol: POSITIVE — AB
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
## 2011-03-20 LAB — CBC
HCT: 42.6
Hemoglobin: 14.1
MCV: 90.4
RDW: 12.9
WBC: 27.9 — ABNORMAL HIGH

## 2011-03-20 LAB — COMPREHENSIVE METABOLIC PANEL
Alkaline Phosphatase: 82
BUN: 12
Chloride: 102
Creatinine, Ser: 1.19
Glucose, Bld: 126 — ABNORMAL HIGH
Potassium: 3 — ABNORMAL LOW
Total Bilirubin: 0.5

## 2011-03-20 LAB — URINE MICROSCOPIC-ADD ON

## 2011-03-20 LAB — DIFFERENTIAL
Basophils Absolute: 0
Eosinophils Absolute: 0 — ABNORMAL LOW
Lymphocytes Relative: 8 — ABNORMAL LOW
Monocytes Relative: 6
Neutrophils Relative %: 86 — ABNORMAL HIGH

## 2011-03-20 LAB — URINALYSIS, ROUTINE W REFLEX MICROSCOPIC
Bilirubin Urine: NEGATIVE
Glucose, UA: NEGATIVE
Nitrite: NEGATIVE
Specific Gravity, Urine: >1.03 — ABNORMAL HIGH
pH: 5

## 2011-03-20 LAB — PHENYTOIN LEVEL, TOTAL: Phenytoin Lvl: <2.5 — ABNORMAL LOW

## 2011-03-20 LAB — RAPID URINE DRUG SCREEN, HOSP PERFORMED
Barbiturates: NOT DETECTED
Opiates: NOT DETECTED

## 2011-03-20 LAB — ETHANOL: Alcohol, Ethyl (B): <5

## 2011-03-23 LAB — RAPID URINE DRUG SCREEN, HOSP PERFORMED
Benzodiazepines: POSITIVE — AB
Cocaine: NOT DETECTED
Tetrahydrocannabinol: NOT DETECTED

## 2011-03-23 LAB — DIFFERENTIAL
Basophils Absolute: 0.1
Eosinophils Absolute: 0.2
Eosinophils Relative: 2
Lymphocytes Relative: 28
Neutrophils Relative %: 62

## 2011-03-23 LAB — URINALYSIS, ROUTINE W REFLEX MICROSCOPIC
Glucose, UA: NEGATIVE
Ketones, ur: NEGATIVE
Protein, ur: NEGATIVE

## 2011-03-23 LAB — CBC
HCT: 31.9 — ABNORMAL LOW
Platelets: 147 — ABNORMAL LOW
RDW: 13.1

## 2011-03-23 LAB — I-STAT 8, (EC8 V) (CONVERTED LAB)
BUN: 5 — ABNORMAL LOW
Chloride: 106
HCT: 34 — ABNORMAL LOW
Hemoglobin: 11.6 — ABNORMAL LOW
Operator id: 284251
Sodium: 140

## 2011-03-23 LAB — ETHANOL: Alcohol, Ethyl (B): 181 — ABNORMAL HIGH

## 2011-03-23 LAB — PROTIME-INR: Prothrombin Time: 13.6

## 2011-03-24 LAB — COMPREHENSIVE METABOLIC PANEL
Albumin: 3.7
BUN: 13
Creatinine, Ser: 0.71
GFR calc Af Amer: >60
Total Protein: 7.2

## 2011-03-24 LAB — CBC
HCT: 32 — ABNORMAL LOW
HCT: 42.4
Hemoglobin: 10.7 — ABNORMAL LOW
MCHC: 33.3
MCHC: 33.5
MCV: 90.4
Platelets: 275
RBC: 3.34 — ABNORMAL LOW
RDW: 13.3
RDW: 13.6
WBC: 9.3

## 2011-03-24 LAB — RAPID URINE DRUG SCREEN, HOSP PERFORMED
Amphetamines: NOT DETECTED
Barbiturates: NOT DETECTED

## 2011-03-24 LAB — HEMOGLOBIN A1C: Hgb A1c MFr Bld: 5.6

## 2011-03-24 LAB — TSH: TSH: 0.921

## 2011-03-24 LAB — ETHANOL: Alcohol, Ethyl (B): <5

## 2011-03-24 LAB — POCT CARDIAC MARKERS
CKMB, poc: <1 — ABNORMAL LOW
Myoglobin, poc: 37.4
Myoglobin, poc: 48.5
Operator id: 216151
Operator id: 230251
Troponin i, poc: <0.05
Troponin i, poc: <0.05

## 2011-03-24 LAB — DIFFERENTIAL
Basophils Absolute: 0.2 — ABNORMAL HIGH
Lymphocytes Relative: 21
Lymphs Abs: 2.6
Monocytes Absolute: 0.7
Monocytes Relative: 5
Neutro Abs: 8.9 — ABNORMAL HIGH

## 2011-03-24 LAB — CARDIAC PANEL(CRET KIN+CKTOT+MB+TROPI)
Relative Index: INVALID
Total CK: 35
Total CK: 38
Troponin I: 0.01

## 2011-03-24 LAB — LIPID PANEL
Cholesterol: 99
LDL Cholesterol: 53
Total CHOL/HDL Ratio: 2.9

## 2011-03-24 LAB — HEPARIN LEVEL (UNFRACTIONATED): Heparin Unfractionated: 0.44

## 2011-03-24 LAB — APTT: aPTT: 30

## 2012-12-27 ENCOUNTER — Ambulatory Visit (HOSPITAL_COMMUNITY)
Admission: RE | Admit: 2012-12-27 | Discharge: 2012-12-27 | Disposition: A | Discharge status: Home / Self Care | Payer: Medicare Other | Source: Ambulatory Visit | Attending: Pulmonary Disease | Admitting: Pulmonary Disease

## 2012-12-27 ENCOUNTER — Other Ambulatory Visit (HOSPITAL_COMMUNITY): Payer: Self-pay | Admitting: Pulmonary Disease

## 2012-12-27 DIAGNOSIS — M25529 Pain in unspecified elbow: Secondary | ICD-10-CM | POA: Insufficient documentation

## 2012-12-27 DIAGNOSIS — M25522 Pain in left elbow: Secondary | ICD-10-CM

## 2012-12-27 DIAGNOSIS — T07XXXA Unspecified multiple injuries, initial encounter: Secondary | ICD-10-CM | POA: Insufficient documentation

## 2012-12-27 DIAGNOSIS — M79609 Pain in unspecified limb: Secondary | ICD-10-CM | POA: Insufficient documentation

## 2012-12-27 DIAGNOSIS — W19XXXA Unspecified fall, initial encounter: Secondary | ICD-10-CM | POA: Insufficient documentation

## 2012-12-27 DIAGNOSIS — M25539 Pain in unspecified wrist: Secondary | ICD-10-CM | POA: Insufficient documentation

## 2013-12-08 IMAGING — CR DG ELBOW COMPLETE 3+V*L*
2 series · 2 of 2 positions shown · non-contrast
Comparison: None.

CLINICAL DATA: Left elbow pain

LEFT ELBOW - COMPLETE 3+ VIEW

[view not recorded (1 of 2)]
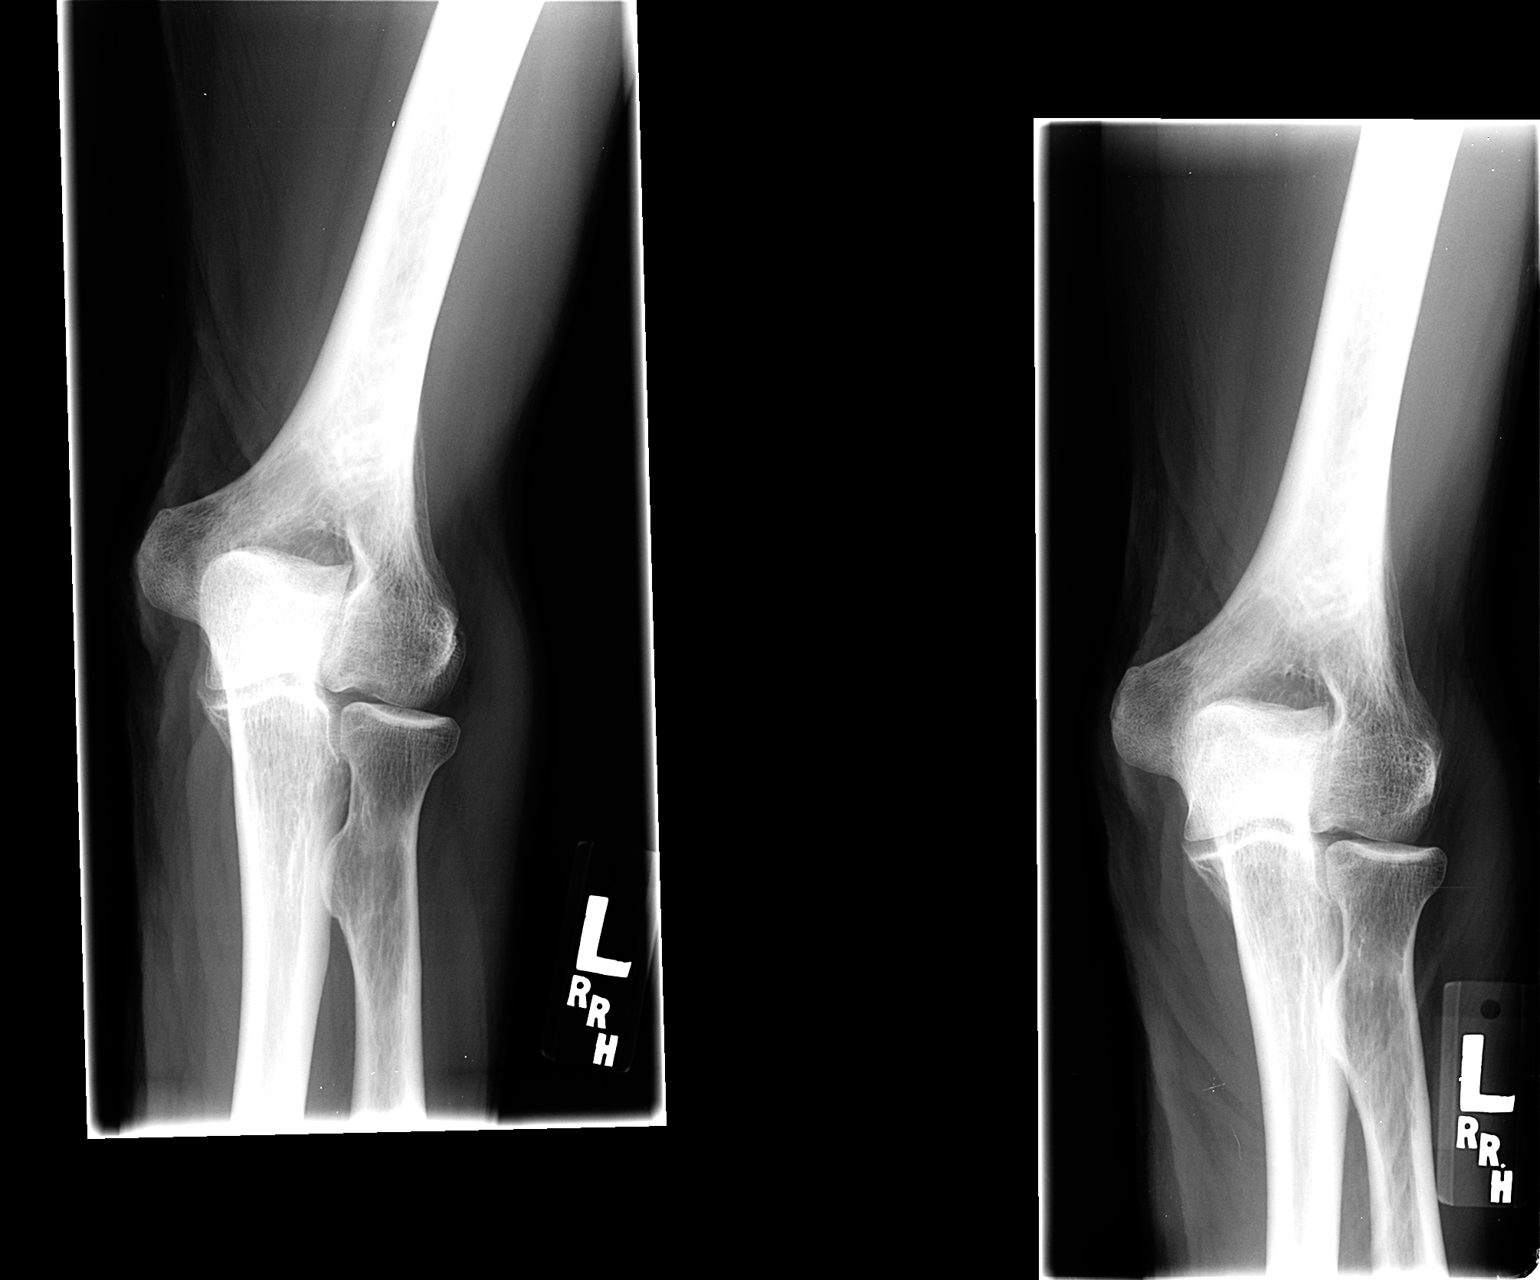

[view not recorded (2 of 2)]
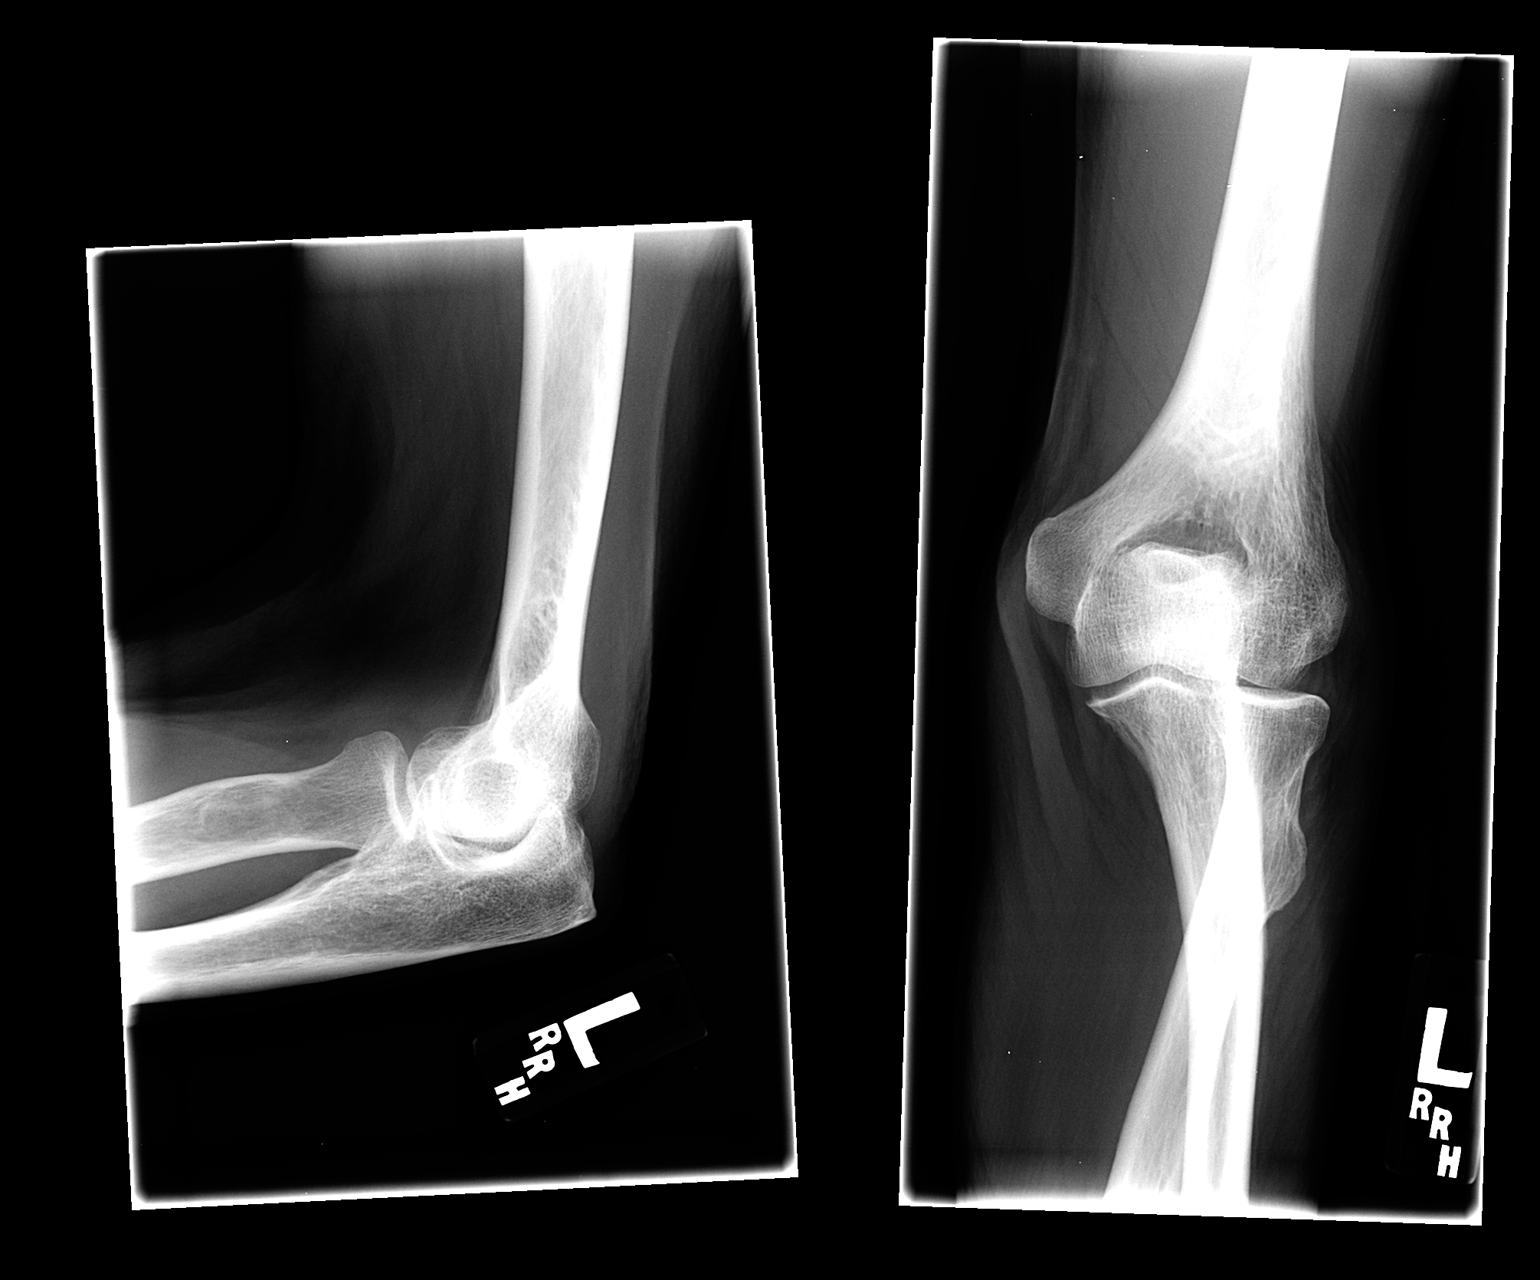

[2 of 2 positions shown; findings below may reference images not displayed]

FINDINGS: There is no acute fracture or dislocation.  The soft
tissues are normal.  No significant degenerative joint changes are
noted.
IMPRESSION: No acute abnormality identified.

## 2013-12-08 IMAGING — CR DG WRIST COMPLETE 3+V*L*
2 series · 2 of 2 positions shown · non-contrast
Comparison: None

CLINICAL DATA: Left wrist and hand pain post fall, loss of control
of the fingers and hand

LEFT WRIST - COMPLETE 3+ VIEW

[view not recorded (1 of 2)]
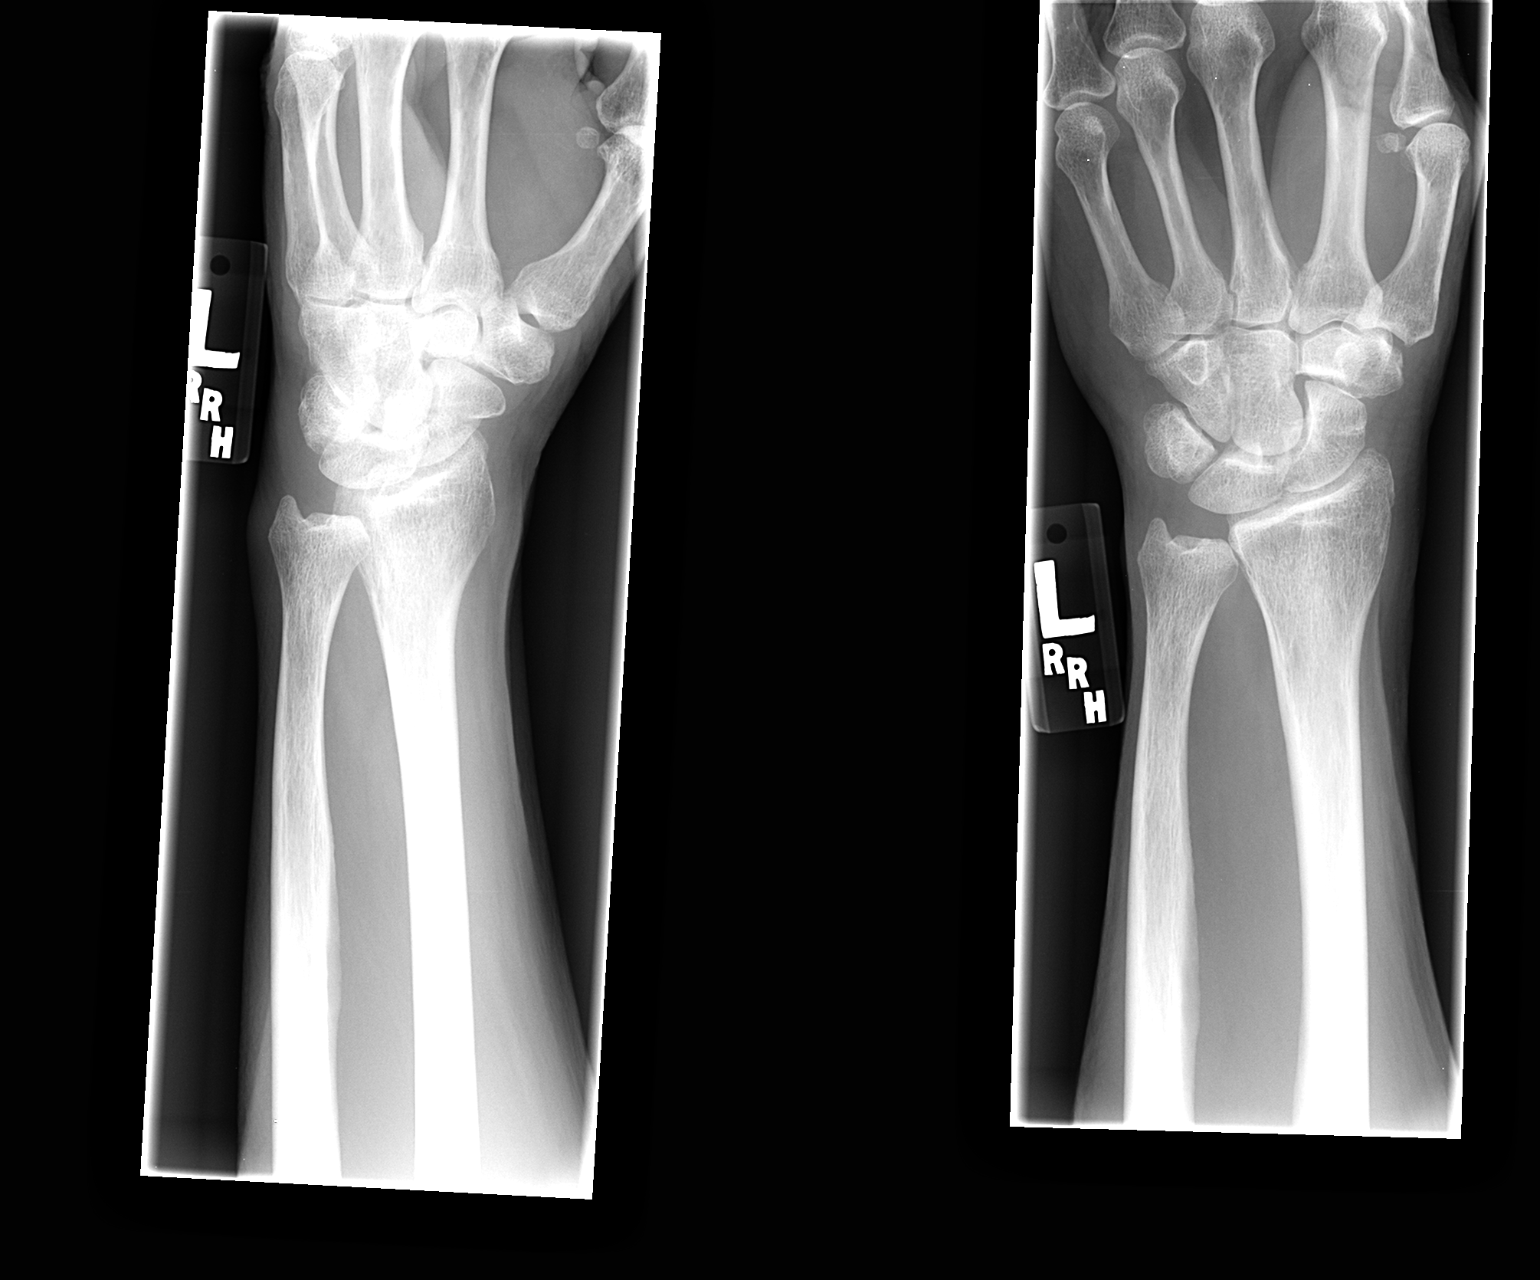

[view not recorded (2 of 2)]
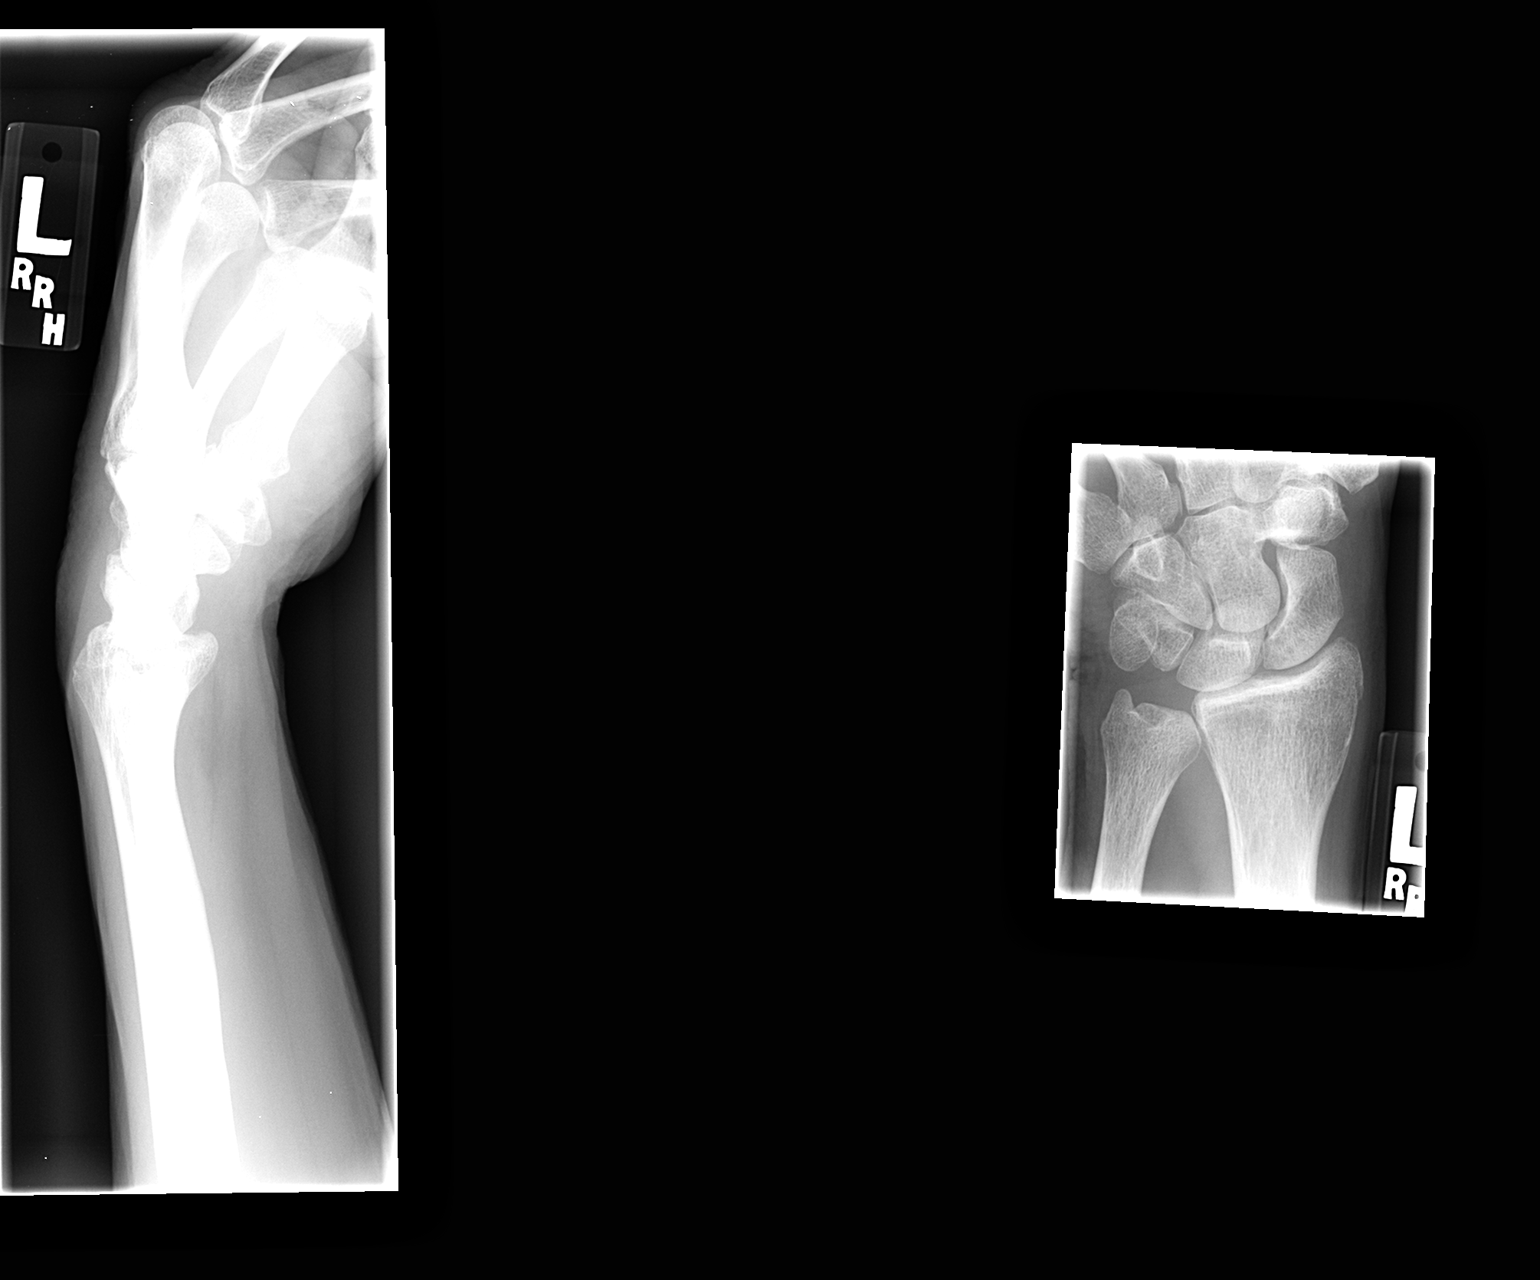

[2 of 2 positions shown; findings below may reference images not displayed]

FINDINGS: Osseous demineralization.
Borderline ulnar minus variance.
Joint spaces preserved.
No acute fracture, dislocation, or bone destruction.
IMPRESSION: No acute osseous abnormalities.

## 2014-05-01 ENCOUNTER — Ambulatory Visit: Payer: Medicare Other | Admitting: Urology

## 2018-10-25 ENCOUNTER — Other Ambulatory Visit (HOSPITAL_COMMUNITY): Payer: Self-pay | Admitting: Pulmonary Disease

## 2018-10-25 DIAGNOSIS — R609 Edema, unspecified: Secondary | ICD-10-CM

## 2018-11-11 ENCOUNTER — Other Ambulatory Visit (HOSPITAL_COMMUNITY): Payer: Medicare Other

## 2018-12-06 ENCOUNTER — Encounter (HOSPITAL_COMMUNITY): Payer: Self-pay

## 2018-12-06 ENCOUNTER — Other Ambulatory Visit: Payer: Self-pay

## 2018-12-06 ENCOUNTER — Encounter (HOSPITAL_COMMUNITY)
Admission: RE | Admit: 2018-12-06 | Discharge: 2018-12-06 | Disposition: A | Discharge status: Home / Self Care | Payer: Medicare Other | Source: Ambulatory Visit | Attending: Ophthalmology | Admitting: Ophthalmology

## 2018-12-06 ENCOUNTER — Other Ambulatory Visit (HOSPITAL_COMMUNITY)
Admission: RE | Admit: 2018-12-06 | Discharge: 2018-12-06 | Disposition: A | Discharge status: Home / Self Care | Payer: Medicare Other | Source: Ambulatory Visit | Attending: Ophthalmology | Admitting: Ophthalmology

## 2018-12-06 DIAGNOSIS — Z1159 Encounter for screening for other viral diseases: Secondary | ICD-10-CM | POA: Insufficient documentation

## 2018-12-06 HISTORY — DX: Unspecified convulsions: R56.9

## 2018-12-06 HISTORY — DX: Depression, unspecified: F32.A

## 2018-12-06 HISTORY — DX: Pure hypercholesterolemia, unspecified: E78.00

## 2018-12-06 HISTORY — DX: Anxiety disorder, unspecified: F41.9

## 2018-12-06 NOTE — Pre-Procedure Instructions (Signed)
No labs needed per Dr Currie Paris.

## 2018-12-06 NOTE — Pre-Procedure Instructions (Signed)
Patient was a No show for his PAT, but did go for his covid test. I called patient and he "forgot all about that, but I got that swab thing done". I obtained medical history over the phone and will see if Dr Currie Paris wants labs on the morning of his surgery.

## 2018-12-07 LAB — NOVEL CORONAVIRUS, NAA (HOSP ORDER, SEND-OUT TO REF LAB; TAT 18-24 HRS): SARS-CoV-2, NAA: NOT DETECTED

## 2018-12-09 ENCOUNTER — Encounter (HOSPITAL_COMMUNITY)
Admission: RE | Disposition: A | Discharge status: Home / Self Care | Payer: Self-pay | Source: Ambulatory Visit | Attending: Ophthalmology

## 2018-12-09 ENCOUNTER — Encounter (HOSPITAL_COMMUNITY): Payer: Self-pay | Admitting: Emergency Medicine

## 2018-12-09 ENCOUNTER — Other Ambulatory Visit: Payer: Self-pay

## 2018-12-09 ENCOUNTER — Ambulatory Visit (HOSPITAL_COMMUNITY)
Admission: RE | Admit: 2018-12-09 | Discharge: 2018-12-09 | Disposition: A | Discharge status: Home / Self Care | Payer: Medicare Other | Source: Ambulatory Visit | Attending: Ophthalmology | Admitting: Ophthalmology

## 2018-12-09 ENCOUNTER — Ambulatory Visit (HOSPITAL_COMMUNITY): Payer: Medicare Other | Admitting: Anesthesiology

## 2018-12-09 DIAGNOSIS — Z79899 Other long term (current) drug therapy: Secondary | ICD-10-CM | POA: Insufficient documentation

## 2018-12-09 DIAGNOSIS — Z7982 Long term (current) use of aspirin: Secondary | ICD-10-CM | POA: Diagnosis not present

## 2018-12-09 DIAGNOSIS — E7800 Pure hypercholesterolemia, unspecified: Secondary | ICD-10-CM | POA: Insufficient documentation

## 2018-12-09 DIAGNOSIS — R569 Unspecified convulsions: Secondary | ICD-10-CM | POA: Diagnosis not present

## 2018-12-09 DIAGNOSIS — I10 Essential (primary) hypertension: Secondary | ICD-10-CM | POA: Insufficient documentation

## 2018-12-09 DIAGNOSIS — F17200 Nicotine dependence, unspecified, uncomplicated: Secondary | ICD-10-CM | POA: Diagnosis not present

## 2018-12-09 DIAGNOSIS — H259 Unspecified age-related cataract: Secondary | ICD-10-CM | POA: Diagnosis present

## 2018-12-09 HISTORY — DX: Essential (primary) hypertension: I10

## 2018-12-09 HISTORY — PX: CATARACT EXTRACTION W/PHACO: SHX586

## 2018-12-09 SURGERY — PHACOEMULSIFICATION, CATARACT, WITH IOL INSERTION
Anesthesia: Monitor Anesthesia Care | Site: Eye | Laterality: Left

## 2018-12-09 MED ORDER — BSS IO SOLN
INTRAOCULAR | Status: DC | PRN
  Administered 2018-12-09: 15 mL

## 2018-12-09 MED ORDER — PROVISC 10 MG/ML IO SOLN
INTRAOCULAR | Status: DC | PRN
  Administered 2018-12-09: 0.85 mL via INTRAOCULAR

## 2018-12-09 MED ORDER — LIDOCAINE HCL 3.5 % OP GEL: 1.0000 "application " | Freq: Once | OPHTHALMIC | Status: DC

## 2018-12-09 MED ORDER — POVIDONE-IODINE 5 % OP SOLN
OPHTHALMIC | Status: DC | PRN
  Administered 2018-12-09: 1 via OPHTHALMIC

## 2018-12-09 MED ORDER — EPINEPHRINE PF 1 MG/ML IJ SOLN
INTRAOCULAR | Status: DC | PRN
  Administered 2018-12-09: 500 mL

## 2018-12-09 MED ORDER — TRYPAN BLUE 0.06 % OP SOLN
OPHTHALMIC | Status: DC | PRN
  Administered 2018-12-09: 0.5 mL via INTRAOCULAR

## 2018-12-09 MED ORDER — SODIUM HYALURONATE 23 MG/ML IO SOLN
INTRAOCULAR | Status: DC | PRN
  Administered 2018-12-09: 0.6 mL via INTRAOCULAR

## 2018-12-09 MED ORDER — TETRACAINE HCL 0.5 % OP SOLN
1.0000 [drp] | OPHTHALMIC | Status: AC
  Administered 2018-12-09 (×3): 1 [drp] via OPHTHALMIC

## 2018-12-09 MED ORDER — TRYPAN BLUE 0.06 % OP SOLN
OPHTHALMIC | Status: AC
  Filled 2018-12-09: qty 0.5

## 2018-12-09 MED ORDER — PHENYLEPHRINE HCL 2.5 % OP SOLN
1.0000 [drp] | OPHTHALMIC | Status: AC
  Administered 2018-12-09 (×3): 1 [drp] via OPHTHALMIC

## 2018-12-09 MED ORDER — NEOMYCIN-POLYMYXIN-DEXAMETH 3.5-10000-0.1 OP SUSP
OPHTHALMIC | Status: DC | PRN
  Administered 2018-12-09: 1 [drp] via OPHTHALMIC

## 2018-12-09 MED ORDER — EPINEPHRINE PF 1 MG/ML IJ SOLN
INTRAMUSCULAR | Status: AC
  Filled 2018-12-09: qty 2

## 2018-12-09 MED ORDER — CYCLOPENTOLATE-PHENYLEPHRINE 0.2-1 % OP SOLN
1.0000 [drp] | OPHTHALMIC | Status: AC
  Administered 2018-12-09 (×3): 1 [drp] via OPHTHALMIC

## 2018-12-09 MED ORDER — LIDOCAINE HCL (PF) 1 % IJ SOLN
INTRAOCULAR | Status: DC | PRN
  Administered 2018-12-09: .9 mL via OPHTHALMIC

## 2018-12-09 SURGICAL SUPPLY — 12 items
CLOTH BEACON ORANGE TIMEOUT ST (SAFETY) ×1 IMPLANT
EYE SHIELD UNIVERSAL CLEAR (GAUZE/BANDAGES/DRESSINGS) ×1 IMPLANT
GLOVE BIOGEL PI IND STRL 7.0 (GLOVE) IMPLANT
GLOVE BIOGEL PI INDICATOR 7.0 (GLOVE) ×2
LENS ALC ACRYL/TECN (Ophthalmic Related) ×1 IMPLANT
NDL HYPO 18GX1.5 BLUNT FILL (NEEDLE) IMPLANT
NEEDLE HYPO 18GX1.5 BLUNT FILL (NEEDLE) ×2 IMPLANT
PAD ARMBOARD 7.5X6 YLW CONV (MISCELLANEOUS) ×1 IMPLANT
SYR TB 1ML LL NO SAFETY (SYRINGE) ×1 IMPLANT
TAPE SURG TRANSPORE 1 IN (GAUZE/BANDAGES/DRESSINGS) IMPLANT
TAPE SURGICAL TRANSPORE 1 IN (GAUZE/BANDAGES/DRESSINGS) ×1
WATER STERILE IRR 250ML POUR (IV SOLUTION) ×1 IMPLANT

## 2018-12-09 NOTE — Discharge Instructions (Signed)
Please discharge patient when stable, will follow up today with Dr. Yumalay Circle at the [facility] office immediately following discharge.  Leave shield in place until visit.  All paperwork with discharge instructions will be given at the office. ° °

## 2018-12-09 NOTE — Transfer of Care (Signed)
Immediate Anesthesia Transfer of Care Note  Patient: [name]  Procedure(s) Performed: CATARACT EXTRACTION PHACO AND INTRAOCULAR LENS PLACEMENT LEFT EYE (Left Eye)  Patient Location: Short Stay  Anesthesia Type:MAC  Level of Consciousness: awake and patient cooperative  Airway & Oxygen Therapy: Patient Spontanous Breathing  Post-op Assessment: Report given to RN, Post -op Vital signs reviewed and stable and Patient moving all extremities  Post vital signs: Reviewed and stable  Last Vitals:  Vitals Value Taken Time  BP    Temp    Pulse    Resp    SpO2      Last Pain:  Vitals:   12/09/18 0913  TempSrc: Oral  PainSc: 7          Complications: No apparent anesthesia complications

## 2018-12-09 NOTE — Anesthesia Preprocedure Evaluation (Signed)
Anesthesia Evaluation    Airway Mallampati: II       Dental  (+) Poor Dentition, Chipped, Missing, Loose   Pulmonary Current Smoker,    breath sounds clear to auscultation       Cardiovascular hypertension,  Rhythm:regular     Neuro/Psych Seizures -,     GI/Hepatic   Endo/Other    Renal/GU      Musculoskeletal   Abdominal   Peds  Hematology   Anesthesia Other Findings Sig tobacco abuse   Reproductive/Obstetrics                             Anesthesia Physical Anesthesia Plan  ASA: III  Anesthesia Plan:    Post-op Pain Management:    Induction:   PONV Risk Score and Plan:   Airway Management Planned:   Additional Equipment:   Intra-op Plan:   Post-operative Plan:   Informed Consent: I have reviewed the patients History and Physical, chart, labs and discussed the procedure including the risks, benefits and alternatives for the proposed anesthesia with the patient or authorized representative who has indicated his/her understanding and acceptance.       Plan Discussed with: Anesthesiologist  Anesthesia Plan Comments:         Anesthesia Quick Evaluation

## 2018-12-09 NOTE — Anesthesia Postprocedure Evaluation (Signed)
Anesthesia Post Note  Patient: [name]  Procedure(s) Performed: CATARACT EXTRACTION PHACO AND INTRAOCULAR LENS PLACEMENT LEFT EYE (Left Eye)  Patient location during evaluation: Short Stay Anesthesia Type: MAC Level of consciousness: awake and alert and patient cooperative Pain management: pain level controlled Vital Signs Assessment: post-procedure vital signs reviewed and stable Respiratory status: spontaneous breathing and respiratory function stable Cardiovascular status: blood pressure returned to baseline Postop Assessment: no apparent nausea or vomiting Anesthetic complications: no     Last Vitals:  Vitals:   12/09/18 0913  BP: 107/66  Pulse: 66  Resp: 19  Temp: 36.6 C  SpO2: 98%    Last Pain:  Vitals:   12/09/18 0913  TempSrc: Oral  PainSc: 7                  Marian Grandt J

## 2018-12-09 NOTE — H&P (Signed)
The H and P was reviewed and updated. The patient was examined.  No changes were found after exam.  The surgical eye was marked.  

## 2018-12-09 NOTE — Progress Notes (Addendum)
Helene Kelp notified via telephone [phone] notified for patient pickup per patient request

## 2018-12-09 NOTE — Op Note (Signed)
Date of procedure: 12/09/18  Pre-operative diagnosis: Mature, Visually significant age-related cataract, Left Eye (H25.22)  Post-operative diagnosis: Mature Visually significant age-related cataract, Left Eye  Procedure: Complex Removal of cataract via phacoemulsification and insertion of intra-ocular lens AMO PCB00 +18.5D into the capsular bag of the Left Eye  Attending surgeon: Gerda Diss. Metro [place], MD, MA  Anesthesia: MAC, Topical Akten  Complications: None  Estimated Blood Loss: <69m (minimal)  Specimens: None  Implants: As above  Indications:  Mature Visually significant age-related cataract, Left Eye  Procedure:  The patient was seen and identified in the pre-operative area. The operative eye was identified and dilated.  The operative eye was marked.  Topical anesthesia was administered to the operative eye.     The patient was then to the operative suite and placed in the supine position.  A timeout was performed confirming the patient, procedure to be performed, and all other relevant information.   The patient's face was prepped and draped in the usual fashion for intra-ocular surgery.  A lid speculum was placed into the operative eye and the surgical microscope moved into place and focused.  A lack of red reflex due to a mature cataract was confirmed.  An inferotemporal paracentesis was created using a 20 gauge paracentesis blade.  Vision blue was injected into the anterior chamber.  Shugarcaine was injected into the anterior chamber.  Viscoelastic was injected into the anterior chamber.  A temporal clear-corneal main wound incision was created using a 2.462mmicrokeratome.  A continuous curvilinear capsulorrhexis was initiated using an irrigating cystitome and completed using capsulorrhexis forceps.  Hydrodissection and hydrodeliniation were performed.  Viscoelastic was injected into the anterior chamber.  A phacoemulsification handpiece and a chopper as a second instrument were used  to remove the nucleus and epinucleus. The irrigation/aspiration handpiece was used to remove any remaining cortical material.   The capsular bag was reinflated with viscoelastic, checked, and found to be intact. The intraocular lens was inserted into the capsular bag and dialed into place using a kuglen hook.  The irrigation/aspiration handpiece was used to remove any remaining viscoelastic.  The clear corneal wound and paracentesis wounds were then hydrated and checked with Weck-Cels to be watertight.  The lid-speculum and drape was removed, and the patient's face was cleaned with a wet and dry 4x4.  Maxitrol was instilled in the eye before a clear shield was taped over the eye. The patient was taken to the post-operative care unit in good condition, having tolerated the procedure well.  Post-Op Instructions: [facility] and will receive all other orders and instructions.

## 2018-12-12 ENCOUNTER — Encounter (HOSPITAL_COMMUNITY): Payer: Self-pay | Admitting: Ophthalmology

## 2019-01-10 ENCOUNTER — Encounter (HOSPITAL_COMMUNITY)
Admission: RE | Admit: 2019-01-10 | Discharge: 2019-01-10 | Disposition: A | Discharge status: Home / Self Care | Payer: Medicare Other | Source: Ambulatory Visit | Attending: Ophthalmology | Admitting: Ophthalmology

## 2019-01-10 ENCOUNTER — Other Ambulatory Visit: Payer: Self-pay

## 2019-01-11 ENCOUNTER — Other Ambulatory Visit: Payer: Self-pay

## 2019-01-11 ENCOUNTER — Other Ambulatory Visit (HOSPITAL_COMMUNITY)
Admission: RE | Admit: 2019-01-11 | Discharge: 2019-01-11 | Disposition: A | Discharge status: Home / Self Care | Payer: Medicare Other | Source: Ambulatory Visit | Attending: Ophthalmology | Admitting: Ophthalmology

## 2019-01-11 DIAGNOSIS — Z20828 Contact with and (suspected) exposure to other viral communicable diseases: Secondary | ICD-10-CM | POA: Insufficient documentation

## 2019-01-11 LAB — SARS CORONAVIRUS 2 (TAT 6-24 HRS): SARS Coronavirus 2: NEGATIVE

## 2019-01-13 ENCOUNTER — Ambulatory Visit (HOSPITAL_COMMUNITY): Payer: Medicare Other | Admitting: Anesthesiology

## 2019-01-13 ENCOUNTER — Encounter (HOSPITAL_COMMUNITY): Payer: Self-pay

## 2019-01-13 ENCOUNTER — Encounter (HOSPITAL_COMMUNITY)
Admission: RE | Disposition: A | Discharge status: Home / Self Care | Payer: Self-pay | Source: Ambulatory Visit | Attending: Ophthalmology

## 2019-01-13 ENCOUNTER — Other Ambulatory Visit: Payer: Self-pay

## 2019-01-13 ENCOUNTER — Ambulatory Visit (HOSPITAL_COMMUNITY)
Admission: RE | Admit: 2019-01-13 | Discharge: 2019-01-13 | Disposition: A | Discharge status: Home / Self Care | Payer: Medicare Other | Source: Ambulatory Visit | Attending: Ophthalmology | Admitting: Ophthalmology

## 2019-01-13 DIAGNOSIS — F17200 Nicotine dependence, unspecified, uncomplicated: Secondary | ICD-10-CM | POA: Diagnosis not present

## 2019-01-13 DIAGNOSIS — H2521 Age-related cataract, morgagnian type, right eye: Secondary | ICD-10-CM | POA: Diagnosis present

## 2019-01-13 DIAGNOSIS — Z7982 Long term (current) use of aspirin: Secondary | ICD-10-CM | POA: Diagnosis not present

## 2019-01-13 DIAGNOSIS — E7800 Pure hypercholesterolemia, unspecified: Secondary | ICD-10-CM | POA: Diagnosis not present

## 2019-01-13 DIAGNOSIS — R569 Unspecified convulsions: Secondary | ICD-10-CM | POA: Diagnosis not present

## 2019-01-13 DIAGNOSIS — Z79899 Other long term (current) drug therapy: Secondary | ICD-10-CM | POA: Diagnosis not present

## 2019-01-13 DIAGNOSIS — F329 Major depressive disorder, single episode, unspecified: Secondary | ICD-10-CM | POA: Insufficient documentation

## 2019-01-13 DIAGNOSIS — F419 Anxiety disorder, unspecified: Secondary | ICD-10-CM | POA: Insufficient documentation

## 2019-01-13 DIAGNOSIS — I10 Essential (primary) hypertension: Secondary | ICD-10-CM | POA: Diagnosis not present

## 2019-01-13 HISTORY — PX: CATARACT EXTRACTION W/PHACO: SHX586

## 2019-01-13 SURGERY — PHACOEMULSIFICATION, CATARACT, WITH IOL INSERTION
Anesthesia: Monitor Anesthesia Care | Site: Eye | Laterality: Right

## 2019-01-13 MED ORDER — LIDOCAINE HCL (PF) 1 % IJ SOLN
INTRAOCULAR | Status: DC | PRN
  Administered 2019-01-13: 1 mL via OPHTHALMIC

## 2019-01-13 MED ORDER — NEOMYCIN-POLYMYXIN-DEXAMETH 3.5-10000-0.1 OP SUSP
OPHTHALMIC | Status: DC | PRN
  Administered 2019-01-13: 2 [drp] via OPHTHALMIC

## 2019-01-13 MED ORDER — EPINEPHRINE PF 1 MG/ML IJ SOLN
INTRAOCULAR | Status: DC | PRN
  Administered 2019-01-13: 500 mL

## 2019-01-13 MED ORDER — LIDOCAINE HCL 3.5 % OP GEL
1.0000 "application " | Freq: Once | OPHTHALMIC | Status: AC
  Administered 2019-01-13: 1 via OPHTHALMIC

## 2019-01-13 MED ORDER — PROVISC 10 MG/ML IO SOLN
INTRAOCULAR | Status: DC | PRN
  Administered 2019-01-13: 0.85 mL via INTRAOCULAR

## 2019-01-13 MED ORDER — TRYPAN BLUE 0.06 % OP SOLN
OPHTHALMIC | Status: AC
  Filled 2019-01-13: qty 0.5

## 2019-01-13 MED ORDER — CYCLOPENTOLATE-PHENYLEPHRINE 0.2-1 % OP SOLN
1.0000 [drp] | OPHTHALMIC | Status: AC
  Administered 2019-01-13 (×3): 1 [drp] via OPHTHALMIC

## 2019-01-13 MED ORDER — LACTATED RINGERS IV SOLN: INTRAVENOUS | Status: DC

## 2019-01-13 MED ORDER — BSS IO SOLN
INTRAOCULAR | Status: DC | PRN
  Administered 2019-01-13: 15 mL

## 2019-01-13 MED ORDER — SODIUM HYALURONATE 23 MG/ML IO SOLN
INTRAOCULAR | Status: DC | PRN
  Administered 2019-01-13: 0.6 mL via INTRAOCULAR

## 2019-01-13 MED ORDER — TETRACAINE HCL 0.5 % OP SOLN
1.0000 [drp] | OPHTHALMIC | Status: AC
  Administered 2019-01-13 (×3): 1 [drp] via OPHTHALMIC

## 2019-01-13 MED ORDER — POVIDONE-IODINE 5 % OP SOLN
OPHTHALMIC | Status: DC | PRN
  Administered 2019-01-13: 1 via OPHTHALMIC

## 2019-01-13 MED ORDER — TRYPAN BLUE 0.06 % OP SOLN
OPHTHALMIC | Status: DC | PRN
  Administered 2019-01-13: 0.5 mL via INTRAOCULAR

## 2019-01-13 MED ORDER — PHENYLEPHRINE HCL 2.5 % OP SOLN
1.0000 [drp] | OPHTHALMIC | Status: AC
  Administered 2019-01-13 (×3): 1 [drp] via OPHTHALMIC

## 2019-01-13 SURGICAL SUPPLY — 13 items

## 2019-01-13 NOTE — Discharge Instructions (Signed)
Please discharge patient when stable, will follow up today with Dr. Atonya Templer at the [facility] office immediately following discharge.  Leave shield in place until visit.  All paperwork with discharge instructions will be given at the office. ° °

## 2019-01-13 NOTE — Transfer of Care (Signed)
Immediate Anesthesia Transfer of Care Note  Patient: [name]  Procedure(s) Performed: CATARACT EXTRACTION PHACO AND INTRAOCULAR LENS PLACEMENT (IOC) (Right Eye)  Patient Location: Short Stay  Anesthesia Type:MAC  Level of Consciousness: awake, alert , oriented and patient cooperative  Airway & Oxygen Therapy: Patient Spontanous Breathing  Post-op Assessment: Report given to RN and Post -op Vital signs reviewed and stable  Post vital signs: Reviewed and stable  Last Vitals:  Vitals Value Taken Time  BP    Temp    Pulse    Resp    SpO2      Last Pain:  Vitals:   01/13/19 0852  PainSc: 0-No pain      Patients Stated Pain Goal: 7 (39/53/20 2334)  Complications: No apparent anesthesia complications

## 2019-01-13 NOTE — Anesthesia Preprocedure Evaluation (Signed)
Anesthesia Evaluation  Patient identified by MRN, date of birth, ID band Patient awake    Reviewed: Allergy & Precautions, NPO status , Patient's Chart, lab work & pertinent test results  Airway Mallampati: II  TM Distance: >3 FB Neck ROM: Full    Dental no notable dental hx.    Pulmonary neg pulmonary ROS, Current Smoker,    Pulmonary exam normal breath sounds clear to auscultation       Cardiovascular Exercise Tolerance: Good hypertension, Pt. on medications and Pt. on home beta blockers negative cardio ROS Normal cardiovascular examI Rhythm:Regular Rate:Normal  States last NTG was ~4 months ago  Denies any recent CP   Neuro/Psych Seizures -, Well Controlled,  Anxiety Depression On dilantin -last Sz over 10 years ago  negative psych ROS   GI/Hepatic negative GI ROS, Neg liver ROS,   Endo/Other  negative endocrine ROS  Renal/GU negative Renal ROS  negative genitourinary   Musculoskeletal negative musculoskeletal ROS (+)   Abdominal   Peds negative pediatric ROS (+)  Hematology negative hematology ROS (+)   Anesthesia Other Findings   Reproductive/Obstetrics negative OB ROS                             Anesthesia Physical Anesthesia Plan  ASA: III  Anesthesia Plan: MAC   Post-op Pain Management:    Induction: Intravenous  PONV Risk Score and Plan: 1 and TIVA and Treatment may vary due to age or medical condition  Airway Management Planned: Nasal Cannula and Simple Face Mask  Additional Equipment:   Intra-op Plan:   Post-operative Plan:   Informed Consent: I have reviewed the patients History and Physical, chart, labs and discussed the procedure including the risks, benefits and alternatives for the proposed anesthesia with the patient or authorized representative who has indicated his/her understanding and acceptance.     Dental advisory given  Plan Discussed with:  CRNA  Anesthesia Plan Comments: (Plan Full PPE use  Plan Mac as with prior IOL , reported no problems with last MAC)        Anesthesia Quick Evaluation

## 2019-01-13 NOTE — Anesthesia Postprocedure Evaluation (Signed)
Anesthesia Post Note  Patient: [name]  Procedure(s) Performed: CATARACT EXTRACTION PHACO AND INTRAOCULAR LENS PLACEMENT ([place]) (Right Eye)  Patient location during evaluation: Short Stay Anesthesia Type: MAC Level of consciousness: awake and alert and oriented Pain management: pain level controlled Vital Signs Assessment: post-procedure vital signs reviewed and stable Cardiovascular status: stable Postop Assessment: no apparent nausea or vomiting Anesthetic complications: no     Last Vitals:  Vitals:   01/13/19 0852  BP: 118/69  Pulse: 62  Resp: 16  Temp: 36.7 C  SpO2: 100%    Last Pain:  Vitals:   01/13/19 0852  PainSc: 0-No pain                 ADAMS, AMY A

## 2019-01-13 NOTE — H&P (Signed)
The H and P was reviewed and updated. The patient was examined.  No changes were found after exam.  The surgical eye was marked.  

## 2019-01-13 NOTE — Op Note (Signed)
Date of procedure: 01/13/19  Pre-operative diagnosis: Mature Visually significant age-related cataract, Right Eye (H25.21)  Post-operative diagnosis: Mature Visually significant age-related cataract, Right Eye  Procedure: Complex Removal of cataract via phacoemulsification and insertion of intra-ocular lens AMO PCB00 +19.0D into the capsular bag of the Right Eye  Attending surgeon: Gerda Diss. Duante Arocho, MD, MA  Anesthesia: MAC, Topical Akten  Complications: None  Estimated Blood Loss: <31m (minimal)  Specimens: None  Implants: As above  Indications:  Mature Visually significant age-related cataract, Right Eye  Procedure:  The patient was seen and identified in the pre-operative area. The operative eye was identified and dilated.  The operative eye was marked.  Topical anesthesia was administered to the operative eye.     The patient was then to the operative suite and placed in the supine position.  A timeout was performed confirming the patient, procedure to be performed, and all other relevant information.   The patient's face was prepped and draped in the usual fashion for intra-ocular surgery.  A lid speculum was placed into the operative eye and the surgical microscope moved into place and focused.  A lack of red reflex due to a mature cataract was confirmed.  A superotemporal paracentesis was created using a 20 gauge paracentesis blade.  Vision blue was injected into the anterior chamber.  Shugarcaine was injected into the anterior chamber.  Viscoelastic was injected into the anterior chamber.  A temporal clear-corneal main wound incision was created using a 2.456mmicrokeratome.  A continuous curvilinear capsulorrhexis was initiated using an irrigating cystitome and completed using capsulorrhexis forceps.  Hydrodissection and hydrodeliniation were performed.  Viscoelastic was injected into the anterior chamber.  A phacoemulsification handpiece and a chopper as a second instrument were used  to remove the nucleus and epinucleus. The irrigation/aspiration handpiece was used to remove any remaining cortical material.   The capsular bag was reinflated with viscoelastic, checked, and found to be intact. The intraocular lens was inserted into the capsular bag and dialed into place using a kuglen hook.  The irrigation/aspiration handpiece was used to remove any remaining viscoelastic.  The clear corneal wound and paracentesis wounds were then hydrated and checked with Weck-Cels to be watertight.  The lid-speculum and drape was removed, and the patient's face was cleaned with a wet and dry 4x4.  Maxitrol was instilled in the eye before a clear shield was taped over the eye. The patient was taken to the post-operative care unit in good condition, having tolerated the procedure well.  Post-Op Instructions: [facility] and will receive all other orders and instructions.

## 2019-01-13 NOTE — Anesthesia Procedure Notes (Signed)
Procedure Name: MAC Date/Time: 01/13/2019 9:48 AM Performed by: Andree Elk Kiyoshi Schaab A, CRNA Pre-anesthesia Checklist: Patient identified, Emergency Drugs available, Suction available, Timeout performed and Patient being monitored Patient Re-evaluated:Patient Re-evaluated prior to induction Oxygen Delivery Method: Nasal Cannula

## 2019-01-16 ENCOUNTER — Encounter (HOSPITAL_COMMUNITY): Payer: Self-pay | Admitting: Ophthalmology
